# Patient Record
Sex: Male | Born: 1955 | Race: White | Hispanic: No | Marital: Married | Smoking: Former smoker
Health system: Southern US, Community
[De-identification: ages and names within clinical notes are randomized; demographics above are authoritative.]

## PROBLEM LIST (undated history)

## (undated) DIAGNOSIS — R011 Cardiac murmur, unspecified: Secondary | ICD-10-CM

## (undated) DIAGNOSIS — E7800 Pure hypercholesterolemia, unspecified: Secondary | ICD-10-CM

## (undated) DIAGNOSIS — I779 Disorder of arteries and arterioles, unspecified: Secondary | ICD-10-CM

## (undated) DIAGNOSIS — I739 Peripheral vascular disease, unspecified: Secondary | ICD-10-CM

## (undated) DIAGNOSIS — I2510 Atherosclerotic heart disease of native coronary artery without angina pectoris: Secondary | ICD-10-CM

## (undated) DIAGNOSIS — C61 Malignant neoplasm of prostate: Secondary | ICD-10-CM

## (undated) DIAGNOSIS — Z87442 Personal history of urinary calculi: Secondary | ICD-10-CM

## (undated) HISTORY — DX: Peripheral vascular disease, unspecified: I73.9

## (undated) HISTORY — DX: Disorder of arteries and arterioles, unspecified: I77.9

---
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
## 2002-12-08 ENCOUNTER — Ambulatory Visit (HOSPITAL_COMMUNITY): Admission: RE | Admit: 2002-12-08 | Discharge: 2002-12-08 | Payer: Self-pay | Admitting: Gastroenterology

## 2003-11-26 DIAGNOSIS — C61 Malignant neoplasm of prostate: Secondary | ICD-10-CM

## 2003-11-26 HISTORY — DX: Malignant neoplasm of prostate: C61

## 2003-11-26 HISTORY — PX: PROSTATE BIOPSY: SHX241

## 2003-11-26 HISTORY — PX: PROSTATECTOMY: SHX69

## 2004-06-25 ENCOUNTER — Ambulatory Visit: Admission: RE | Admit: 2004-06-25 | Discharge: 2004-08-09 | Payer: Self-pay | Admitting: Radiation Oncology

## 2004-06-27 ENCOUNTER — Inpatient Hospital Stay (HOSPITAL_COMMUNITY): Admission: RE | Admit: 2004-06-27 | Discharge: 2004-06-29 | Payer: Self-pay | Admitting: Urology

## 2005-05-06 ENCOUNTER — Emergency Department (HOSPITAL_COMMUNITY): Admission: EM | Admit: 2005-05-06 | Discharge: 2005-05-06 | Payer: Self-pay | Admitting: Emergency Medicine

## 2015-12-27 ENCOUNTER — Other Ambulatory Visit: Payer: Self-pay | Admitting: Gastroenterology

## 2016-01-19 ENCOUNTER — Encounter (HOSPITAL_COMMUNITY): Payer: Self-pay | Admitting: *Deleted

## 2016-01-29 ENCOUNTER — Ambulatory Visit (HOSPITAL_COMMUNITY)
Admission: RE | Admit: 2016-01-29 | Discharge: 2016-01-29 | Disposition: A | Discharge status: Home / Self Care | Payer: BLUE CROSS/BLUE SHIELD | Source: Ambulatory Visit | Attending: Gastroenterology | Admitting: Gastroenterology

## 2016-01-29 ENCOUNTER — Ambulatory Visit (HOSPITAL_COMMUNITY): Payer: BLUE CROSS/BLUE SHIELD | Admitting: Anesthesiology

## 2016-01-29 ENCOUNTER — Encounter (HOSPITAL_COMMUNITY): Payer: Self-pay | Admitting: Anesthesiology

## 2016-01-29 ENCOUNTER — Encounter (HOSPITAL_COMMUNITY)
Admission: RE | Disposition: A | Discharge status: Home / Self Care | Payer: Self-pay | Source: Ambulatory Visit | Attending: Gastroenterology

## 2016-01-29 DIAGNOSIS — K529 Noninfective gastroenteritis and colitis, unspecified: Secondary | ICD-10-CM | POA: Insufficient documentation

## 2016-01-29 DIAGNOSIS — Z86010 Personal history of colonic polyps: Secondary | ICD-10-CM | POA: Diagnosis not present

## 2016-01-29 DIAGNOSIS — K5730 Diverticulosis of large intestine without perforation or abscess without bleeding: Secondary | ICD-10-CM | POA: Insufficient documentation

## 2016-01-29 DIAGNOSIS — Z800 Family history of malignant neoplasm of digestive organs: Secondary | ICD-10-CM | POA: Insufficient documentation

## 2016-01-29 DIAGNOSIS — Z87891 Personal history of nicotine dependence: Secondary | ICD-10-CM | POA: Diagnosis not present

## 2016-01-29 DIAGNOSIS — Z1211 Encounter for screening for malignant neoplasm of colon: Secondary | ICD-10-CM | POA: Insufficient documentation

## 2016-01-29 DIAGNOSIS — K627 Radiation proctitis: Secondary | ICD-10-CM | POA: Insufficient documentation

## 2016-01-29 DIAGNOSIS — Z8546 Personal history of malignant neoplasm of prostate: Secondary | ICD-10-CM | POA: Insufficient documentation

## 2016-01-29 HISTORY — PX: COLONOSCOPY WITH PROPOFOL: SHX5780

## 2016-01-29 SURGERY — COLONOSCOPY WITH PROPOFOL
Anesthesia: Monitor Anesthesia Care

## 2016-01-29 MED ORDER — MIDAZOLAM HCL 5 MG/5ML IJ SOLN
INTRAMUSCULAR | Status: DC | PRN
  Administered 2016-01-29: 2 mg via INTRAVENOUS

## 2016-01-29 MED ORDER — MIDAZOLAM HCL 2 MG/2ML IJ SOLN
INTRAMUSCULAR | Status: AC
Start: 2016-01-29 — End: 2016-01-29
  Filled 2016-01-29: qty 2

## 2016-01-29 MED ORDER — PROPOFOL 500 MG/50ML IV EMUL
INTRAVENOUS | Status: DC | PRN
  Administered 2016-01-29: 125 ug/kg/min via INTRAVENOUS

## 2016-01-29 MED ORDER — LACTATED RINGERS IV SOLN
INTRAVENOUS | Status: DC
  Administered 2016-01-29: 1000 mL via INTRAVENOUS
  Administered 2016-01-29: 13:00:00 via INTRAVENOUS

## 2016-01-29 MED ORDER — PROPOFOL 10 MG/ML IV BOLUS
INTRAVENOUS | Status: AC
  Filled 2016-01-29: qty 20

## 2016-01-29 MED ORDER — SODIUM CHLORIDE 0.9 % IV SOLN: INTRAVENOUS | Status: DC

## 2016-01-29 MED ORDER — PROPOFOL 500 MG/50ML IV EMUL
INTRAVENOUS | Status: DC | PRN
  Administered 2016-01-29: 20 mg via INTRAVENOUS
  Administered 2016-01-29: 40 mg via INTRAVENOUS

## 2016-01-29 SURGICAL SUPPLY — 21 items

## 2016-01-29 NOTE — Anesthesia Postprocedure Evaluation (Signed)
Anesthesia Post Note  Patient: Joseph Jimenez  Procedure(s) Performed: Procedure(s) (LRB): COLONOSCOPY WITH PROPOFOL (N/A)  Patient location during evaluation: PACU Anesthesia Type: MAC Level of consciousness: awake and alert Pain management: pain level controlled Vital Signs Assessment: post-procedure vital signs reviewed and stable Respiratory status: spontaneous breathing, nonlabored ventilation, respiratory function stable and patient connected to nasal cannula oxygen Cardiovascular status: stable and blood pressure returned to baseline Anesthetic complications: no    Last Vitals:  Filed Vitals:   01/29/16 1420 01/29/16 1430  BP: 135/83 115/101  Pulse: 85 66  Temp:    Resp: 19 20    Last Pain: There were no vitals filed for this visit.               [name]

## 2016-01-29 NOTE — Op Note (Signed)
Procedure: Surveillance colonoscopy. 05/18/2009 normal surveillance colonoscopy was performed. 01/08/2003 colonoscopy was performed with removal of two diminutive tubular adenomatous polyps from the right colon.  Endoscopist: Earle Gell  Premedication: Propofol administered by anesthesia  Procedure: The patient was placed in the left lateral decubitus position. Anal inspection and digital rectal exam were normal. The Pentax pediatric colonoscope was introduced into the rectum and advanced to the cecum. A normal-appearing ileocecal valve and appendiceal orifice were identified. Colonic preparation for the exam today was good. Withdrawal time was 11 minutes  Rectum. 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 Random biopsies were performed.  Sigmoid colon and descending colon. Colonic diverticulosis  Splenic flexure. Normal  Transverse colon. Normal  Hepatic flexure. Normal  Ascending colon. Normal  Cecum and ileocecal valve. 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 Otherwise normal surveillance colonoscopy  Recommendation: Schedule repeat surveillance colonoscopy in 5 years.

## 2016-01-29 NOTE — Anesthesia Preprocedure Evaluation (Signed)
Anesthesia Evaluation  Patient identified by MRN, date of birth, ID band Patient awake    Reviewed: Allergy & Precautions, NPO status , Patient's Chart, lab work & pertinent test results  Airway Mallampati: II  TM Distance: >3 FB Neck ROM: Full    Dental   Pulmonary former smoker,    breath sounds clear to auscultation       Cardiovascular negative cardio ROS   Rhythm:Regular Rate:Normal     Neuro/Psych negative neurological ROS     GI/Hepatic negative GI ROS, Neg liver ROS,   Endo/Other  negative endocrine ROS  Renal/GU negative Renal ROS     Musculoskeletal   Abdominal   Peds  Hematology negative hematology ROS (+)   Anesthesia Other Findings   Reproductive/Obstetrics                             Anesthesia Physical Anesthesia Plan  ASA: II  Anesthesia Plan: MAC   Post-op Pain Management:    Induction: Intravenous  Airway Management Planned: Simple Face Mask and Natural Airway  Additional Equipment:   Intra-op Plan:   Post-operative Plan:   Informed Consent: I have reviewed the patients History and Physical, chart, labs and discussed the procedure including the risks, benefits and alternatives for the proposed anesthesia with the patient or authorized representative who has indicated his/her understanding and acceptance.     Plan Discussed with: CRNA  Anesthesia Plan Comments:         Anesthesia Quick Evaluation

## 2016-01-29 NOTE — H&P (Signed)
  Procedure: Surveillance colonoscopy. 05/14/2009 normal surveillance colon was performed. 01/08/2003 colonoscopy was performed with removal of two diminutive tubular adenomatous polyps from the right colon  History: The patient is a 60 year old male born 08/10/1956. He is scheduled to undergo a surveillance colonoscopy today.  Medication allergies: None  Past history: Prostate cancer.  Family history: Maternal grandfather died of colon cancer  Exam: The patient is alert and lying comfortably on the endoscopy stretcher. Abdomen is soft and nontender to palpation. Lungs are clear to auscultation. Cardiac exam reveals a regular rhythm.  Plan: Proceed with surveillance colonoscopy

## 2016-01-29 NOTE — Transfer of Care (Signed)
Immediate Anesthesia Transfer of Care Note  Patient: Joseph Jimenez  Procedure(s) Performed: Procedure(s): COLONOSCOPY WITH PROPOFOL (N/A)  Patient Location: PACU  Anesthesia Type:MAC  Level of Consciousness: awake, alert  and oriented  Airway & Oxygen Therapy: Patient Spontanous Breathing and Patient connected to face mask oxygen  Post-op Assessment: Report given to RN  Post vital signs: Reviewed and stable  Last Vitals:  Filed Vitals:   01/29/16 1240  BP: 185/83  Pulse: 74  Temp: 36.8 C  Resp: 14    Complications: No apparent anesthesia complications

## 2016-01-29 NOTE — Discharge Instructions (Signed)

## 2016-10-04 ENCOUNTER — Ambulatory Visit (INDEPENDENT_AMBULATORY_CARE_PROVIDER_SITE_OTHER): Payer: BLUE CROSS/BLUE SHIELD | Admitting: Internal Medicine

## 2016-10-04 ENCOUNTER — Encounter: Payer: Self-pay | Admitting: Internal Medicine

## 2016-10-04 ENCOUNTER — Other Ambulatory Visit: Payer: Self-pay | Admitting: *Deleted

## 2016-10-04 VITALS — BP 140/80 | HR 74 | Ht 71.0 in | Wt 212.0 lb

## 2016-10-04 DIAGNOSIS — R0989 Other specified symptoms and signs involving the circulatory and respiratory systems: Secondary | ICD-10-CM

## 2016-10-04 DIAGNOSIS — R072 Precordial pain: Secondary | ICD-10-CM

## 2016-10-04 MED ORDER — NITROGLYCERIN 0.4 MG SL SUBL: 0.4000 mg | SUBLINGUAL_TABLET | SUBLINGUAL | 3 refills | Status: DC | PRN

## 2016-10-04 NOTE — Patient Instructions (Addendum)
Your physician recommends that you schedule a follow-up appointment in: after tests  Monday 10/07/16,  FASTING lab work :Lipids, CBC at [facility] 3 rd floor    [phone]   Tuesday 10/08/16 at 1300 hrs for Carotid Ultrasound at [facility], off [address]   Parking code :0004    Dr Harrington Challenger cell  [phone]       Start Aspirin 81 mg daily    Take nitroglycerin  As needed    Nitroglycerin sublingual tablets What is this medicine? NITROGLYCERIN (nye troe GLI ser in) is a type of vasodilator. It relaxes blood vessels, increasing the blood and oxygen supply to your heart. This medicine is used to relieve chest pain caused by angina. It is also used to prevent chest pain before activities like climbing stairs, going outdoors in cold weather, or sexual activity. This medicine may be used for other purposes; ask your health care provider or pharmacist if you have questions. What should I tell my health care provider before I take this medicine? They need to know if you have any of these conditions: -anemia -head injury, recent stroke, or bleeding in the brain -liver disease -previous heart attack -an unusual or allergic reaction to nitroglycerin, other medicines, foods, dyes, or preservatives -pregnant or trying to get pregnant -breast-feeding How should I use this medicine? Take this medicine by mouth as needed. At the first sign of an angina attack (chest pain or tightness) place one tablet under your tongue. You can also take this medicine 5 to 10 minutes before an event likely to produce chest pain. Follow the directions on the prescription label. Let the tablet dissolve under the tongue. Do not swallow whole. Replace the dose if you accidentally swallow it. It will help if your mouth is not dry. Saliva around the tablet will help it to dissolve more quickly. Do not eat or drink, smoke or chew tobacco while a tablet is  dissolving. If you are not better within 5 minutes after taking ONE dose of nitroglycerin, call 9-1-1 immediately to seek emergency medical care. Do not take more than 3 nitroglycerin tablets over 15 minutes. If you take this medicine often to relieve symptoms of angina, your doctor or health care professional may provide you with different instructions to manage your symptoms. If symptoms do not go away after following these instructions, it is important to call 9-1-1 immediately. Do not take more than 3 nitroglycerin tablets over 15 minutes. Talk to your pediatrician regarding the use of this medicine in children. Special care may be needed. Overdosage: If you think you have taken too much of this medicine contact a poison control center or emergency room at once. NOTE: This medicine is only for you. Do not share this medicine with others. What if I miss a dose? This does not apply. This medicine is only used as needed. What may interact with this medicine? Do not take this medicine with any of the following medications: -certain migraine medicines like ergotamine and dihydroergotamine (DHE) -medicines used to treat erectile dysfunction like sildenafil, tadalafil, and vardenafil -riociguat This medicine may also interact with the following medications: -alteplase -aspirin -heparin -medicines for high blood pressure -medicines for mental depression -other medicines used to treat angina -phenothiazines like chlorpromazine, mesoridazine, prochlorperazine, thioridazine This list may not describe all possible interactions. Give your health care provider a list of all the medicines, herbs, non-prescription drugs, or dietary supplements you use. Also tell them if you smoke, drink  alcohol, or use illegal drugs. Some items may interact with your medicine. What should I watch for while using this medicine? Tell your doctor or health care professional if you feel your medicine is no longer  working. Keep this medicine with you at all times. Sit or lie down when you take your medicine to prevent falling if you feel dizzy or faint after using it. Try to remain calm. This will help you to feel better faster. If you feel dizzy, take several deep breaths and lie down with your feet propped up, or bend forward with your head resting between your knees. You may get drowsy or dizzy. Do not drive, use machinery, or do anything that needs mental alertness until you know how this drug affects you. Do not stand or sit up quickly, especially if you are an older patient. This reduces the risk of dizzy or fainting spells. Alcohol can make you more drowsy and dizzy. Avoid alcoholic drinks. Do not treat yourself for coughs, colds, or pain while you are taking this medicine without asking your doctor or health care professional for advice. Some ingredients may increase your blood pressure. What side effects may I notice from receiving this medicine? Side effects that you should report to your doctor or health care professional as soon as possible: -blurred vision -dry mouth -skin rash -sweating -the feeling of extreme pressure in the head -unusually weak or tired Side effects that usually do not require medical attention (report to your doctor or health care professional if they continue or are bothersome): -flushing of the face or neck -headache -irregular heartbeat, palpitations -nausea, vomiting This list may not describe all possible side effects. Call your doctor for medical advice about side effects. You may report side effects to FDA at 1-800-FDA-1088. Where should I keep my medicine? Keep out of the reach of children. Store at room temperature between 20 and 25 degrees C (68 and 77 degrees F). Store in Chief of Staff. Protect from light and moisture. Keep tightly closed. Throw away any unused medicine after the expiration date. NOTE: This sheet is a summary. It may not cover all possible  information. If you have questions about this medicine, talk to your doctor, pharmacist, or health care provider.    2016, Elsevier/Gold Standard. (2013-09-09 17:57:36)

## 2016-10-04 NOTE — Progress Notes (Signed)
Cardiology Office Note   Date:  10/04/2016   ID:  Joseph Jimenez, DOB [birth year], MRN [id number]  PCP:  Suzi Roots, MD  Cardiologist:   Dorris Carnes, MD    Pt is self referred for CP   .   History of Present Illness: Joseph Jimenez is a 60 y.o. male with a history of CP  Started This summer  First spell was worst  Occurred while playing golf  He walks and carries bag  Episode occurred when he was just starting out playing that day  Weather was Humid and hot  Couldn't breathe normal  Pain in mid chest  Stopped waking  When got going again  Got worse   Went away for 20 min then went away  Felt OK the rest of the time  Finished playing   Was able to walk faster without a problem   Occasional times after that has had again  Shorter than first spell, Episodes last a few min then gone  No wheezing  Uncomfortable     Then goes away  Episodes occur early on and he is able to play  Tennis late summer,early fall  Hot  Lasted 10 min  Went away  2 days ago he had a spell when he started to play platform tennis  Just starting  Lasted about 5 to 8 min  One thing different that day was that it was cool and rainy  After that he called in to be seen     No episodes at rest  Actvie at other times      No outpatient prescriptions prior to visit.   No facility-administered medications prior to visit.      Allergies:   Patient has no known allergies.   No past medical history on file.  Past Surgical History:  Procedure Laterality Date  . COLONOSCOPY WITH PROPOFOL N/A 01/29/2016   Procedure: COLONOSCOPY WITH PROPOFOL;  Surgeon: Garlan Fair, MD;  Location: [facility];  Service: Endoscopy;  Laterality: N/A;  . HERNIA REPAIR     as child-6 months old -bilateral  . PROSTATE SURGERY       Social History:  The patient  reports that he quit smoking about 22 years ago. His smoking use included Cigarettes. He does not have any smokeless tobacco history on file. He reports that he  does not drink alcohol or use drugs.   Family History:  The patient's family history is not on file.  Mother had heart attack  Silent  ? More in 70d  ROS:  Please see the history of present illness. All other systems are reviewed and  Negative to the above problem except as noted.    PHYSICAL EXAM: VS:  BP 140/80 (BP Location: Right Arm)   Pulse 74   Ht 5\' 11"  (1.803 m)   Wt 212 lb (96.2 kg)   SpO2 95%   BMI 29.57 kg/m   GEN: Well nourished, well developed, in no acute distress  HEENT: normal  Neck: no JVD  Bilateral carotid bruits   NO masses Cardiac: RRR; no murmurs, rubs, or gallops,no edema  Respiratory:  clear to auscultation bilaterally, normal work of breathing GI: soft, nontender, nondistended, + BS  No hepatomegaly  MS: no deformity Moving all extremities   Skin: warm and dry, no rash Neuro:  Strength and sensation are intact Psych: euthymic mood, full affect   EKG:  EKG is ordered today  SR 77 bpm    Lipid Panel  No results found for: CHOL, TRIG, HDL, CHOLHDL, VLDL, LDLCALC, LDLDIRECT    Wt Readings from Last 3 Encounters:  10/04/16 212 lb (96.2 kg)  01/29/16 205 lb (93 kg)      ASSESSMENT AND PLAN:  1  CP  Concerning  Not completely typical for angina  He is able to play after spells resolve  Spellls doe not occur at peak exercise. He denies wheezing. He has a history of HL  On exam bilateral carotid bruits  Recomm:  Continue ASA   Rx for NTG given  Activities as tolerated for now. Set up for GXT Check lipids and CBC  2  Carotid bruits  Set up for carotid USN 3  HL  Check lipids  F/U based on test results     Current medicines are reviewed at length with the patient today.  The patient does not have concerns regarding medicines.  Signed, Dorris Carnes, MD  10/04/2016 3:06 PM    [facility] [address], [place], [place]  [address] Phone: [phone]; Fax: [phone]

## 2016-10-04 NOTE — Progress Notes (Signed)
Call from Dr. Harrington Challenger to schedule GXT for patient next week and she be present.  Per Joseph Jimenez, ok for patient to come in at 9:30 am on 11/14 for GXT.  Dr. Harrington Challenger will inform patient.    Will ask [facility] to schedule this on Monday when they return.  Pt also scheduled for carotid US at the hospital, order placed for that, and has lab appointment for Mon at [facility]   Joseph Jimenez was seen by DR. Ross today at [facility].

## 2016-10-07 ENCOUNTER — Other Ambulatory Visit: Payer: BLUE CROSS/BLUE SHIELD | Admitting: *Deleted

## 2016-10-07 ENCOUNTER — Other Ambulatory Visit: Payer: Self-pay | Admitting: *Deleted

## 2016-10-07 DIAGNOSIS — E7800 Pure hypercholesterolemia, unspecified: Secondary | ICD-10-CM

## 2016-10-07 DIAGNOSIS — R0789 Other chest pain: Secondary | ICD-10-CM

## 2016-10-07 LAB — CBC WITH DIFFERENTIAL/PLATELET
BASOS PCT: 1 %
Basophils Absolute: 78 cells/uL (ref 0–200)
EOS PCT: 3 %
Eosinophils Absolute: 234 cells/uL (ref 15–500)
HEMATOCRIT: 46.8 % (ref 38.5–50.0)
HEMOGLOBIN: 16.2 g/dL (ref 13.2–17.1)
LYMPHS ABS: 2340 {cells}/uL (ref 850–3900)
LYMPHS PCT: 30 %
MCH: 30.1 pg (ref 27.0–33.0)
MCHC: 34.6 g/dL (ref 32.0–36.0)
MCV: 87 fL (ref 80.0–100.0)
MONO ABS: 468 {cells}/uL (ref 200–950)
MPV: 11.2 fL (ref 7.5–12.5)
Monocytes Relative: 6 %
Neutro Abs: 4680 cells/uL (ref 1500–7800)
Neutrophils Relative %: 60 %
Platelets: 188 10*3/uL (ref 140–400)
RBC: 5.38 MIL/uL (ref 4.20–5.80)
RDW: 14.2 % (ref 11.0–15.0)
WBC: 7.8 10*3/uL (ref 3.8–10.8)

## 2016-10-07 LAB — LIPID PANEL
Cholesterol: 244 mg/dL — ABNORMAL HIGH (ref <200)
HDL: 57 mg/dL (ref >40)
LDL Cholesterol: 166 mg/dL — ABNORMAL HIGH (ref <100)
Total CHOL/HDL Ratio: 4.3 Ratio (ref <5.0)
Triglycerides: 106 mg/dL (ref <150)
VLDL: 21 mg/dL (ref <30)

## 2016-10-08 ENCOUNTER — Encounter (HOSPITAL_COMMUNITY)
Admission: AD | Disposition: A | Discharge status: Home / Self Care | Payer: Self-pay | Source: Ambulatory Visit | Attending: Thoracic Surgery (Cardiothoracic Vascular Surgery)

## 2016-10-08 ENCOUNTER — Other Ambulatory Visit: Payer: Self-pay | Admitting: *Deleted

## 2016-10-08 ENCOUNTER — Encounter (HOSPITAL_COMMUNITY): Payer: Self-pay | Admitting: Interventional Cardiology

## 2016-10-08 ENCOUNTER — Ambulatory Visit (INDEPENDENT_AMBULATORY_CARE_PROVIDER_SITE_OTHER): Payer: BLUE CROSS/BLUE SHIELD

## 2016-10-08 ENCOUNTER — Ambulatory Visit (HOSPITAL_BASED_OUTPATIENT_CLINIC_OR_DEPARTMENT_OTHER)
Admission: RE | Admit: 2016-10-08 | Discharge: 2016-10-08 | Disposition: A | Discharge status: Home / Self Care | Payer: BLUE CROSS/BLUE SHIELD | Source: Ambulatory Visit | Attending: Diagnostic Radiology | Admitting: Diagnostic Radiology

## 2016-10-08 ENCOUNTER — Inpatient Hospital Stay (HOSPITAL_COMMUNITY)
Admission: AD | Admit: 2016-10-08 | Discharge: 2016-10-13 | DRG: 234 | Disposition: A | Discharge status: Home / Self Care | Payer: BLUE CROSS/BLUE SHIELD | Source: Ambulatory Visit | Attending: Thoracic Surgery (Cardiothoracic Vascular Surgery) | Admitting: Thoracic Surgery (Cardiothoracic Vascular Surgery)

## 2016-10-08 DIAGNOSIS — Z01810 Encounter for preprocedural cardiovascular examination: Secondary | ICD-10-CM | POA: Diagnosis not present

## 2016-10-08 DIAGNOSIS — I200 Unstable angina: Secondary | ICD-10-CM | POA: Diagnosis present

## 2016-10-08 DIAGNOSIS — Z8546 Personal history of malignant neoplasm of prostate: Secondary | ICD-10-CM | POA: Diagnosis not present

## 2016-10-08 DIAGNOSIS — I2510 Atherosclerotic heart disease of native coronary artery without angina pectoris: Secondary | ICD-10-CM

## 2016-10-08 DIAGNOSIS — Z01818 Encounter for other preprocedural examination: Secondary | ICD-10-CM | POA: Diagnosis not present

## 2016-10-08 DIAGNOSIS — I493 Ventricular premature depolarization: Secondary | ICD-10-CM | POA: Diagnosis present

## 2016-10-08 DIAGNOSIS — E7849 Other hyperlipidemia: Secondary | ICD-10-CM

## 2016-10-08 DIAGNOSIS — I25110 Atherosclerotic heart disease of native coronary artery with unstable angina pectoris: Secondary | ICD-10-CM | POA: Diagnosis not present

## 2016-10-08 DIAGNOSIS — R0789 Other chest pain: Secondary | ICD-10-CM | POA: Diagnosis not present

## 2016-10-08 DIAGNOSIS — Z87442 Personal history of urinary calculi: Secondary | ICD-10-CM | POA: Diagnosis not present

## 2016-10-08 DIAGNOSIS — Z79899 Other long term (current) drug therapy: Secondary | ICD-10-CM

## 2016-10-08 DIAGNOSIS — E784 Other hyperlipidemia: Secondary | ICD-10-CM | POA: Diagnosis not present

## 2016-10-08 DIAGNOSIS — Z8249 Family history of ischemic heart disease and other diseases of the circulatory system: Secondary | ICD-10-CM | POA: Diagnosis not present

## 2016-10-08 DIAGNOSIS — E785 Hyperlipidemia, unspecified: Secondary | ICD-10-CM | POA: Diagnosis not present

## 2016-10-08 DIAGNOSIS — N359 Urethral stricture, unspecified: Secondary | ICD-10-CM | POA: Diagnosis present

## 2016-10-08 DIAGNOSIS — Z7982 Long term (current) use of aspirin: Secondary | ICD-10-CM | POA: Diagnosis not present

## 2016-10-08 DIAGNOSIS — R011 Cardiac murmur, unspecified: Secondary | ICD-10-CM | POA: Diagnosis present

## 2016-10-08 DIAGNOSIS — R338 Other retention of urine: Secondary | ICD-10-CM | POA: Diagnosis not present

## 2016-10-08 DIAGNOSIS — R0989 Other specified symptoms and signs involving the circulatory and respiratory systems: Secondary | ICD-10-CM

## 2016-10-08 DIAGNOSIS — N358 Other urethral stricture: Secondary | ICD-10-CM | POA: Diagnosis not present

## 2016-10-08 DIAGNOSIS — I25119 Atherosclerotic heart disease of native coronary artery with unspecified angina pectoris: Secondary | ICD-10-CM | POA: Diagnosis not present

## 2016-10-08 DIAGNOSIS — D62 Acute posthemorrhagic anemia: Secondary | ICD-10-CM | POA: Diagnosis not present

## 2016-10-08 DIAGNOSIS — J9811 Atelectasis: Secondary | ICD-10-CM | POA: Diagnosis not present

## 2016-10-08 DIAGNOSIS — Z9889 Other specified postprocedural states: Secondary | ICD-10-CM

## 2016-10-08 DIAGNOSIS — Z951 Presence of aortocoronary bypass graft: Secondary | ICD-10-CM

## 2016-10-08 DIAGNOSIS — I370 Nonrheumatic pulmonary valve stenosis: Secondary | ICD-10-CM | POA: Diagnosis not present

## 2016-10-08 DIAGNOSIS — Z87891 Personal history of nicotine dependence: Secondary | ICD-10-CM | POA: Diagnosis not present

## 2016-10-08 DIAGNOSIS — I080 Rheumatic disorders of both mitral and aortic valves: Secondary | ICD-10-CM | POA: Diagnosis not present

## 2016-10-08 DIAGNOSIS — R079 Chest pain, unspecified: Secondary | ICD-10-CM

## 2016-10-08 HISTORY — DX: Pure hypercholesterolemia, unspecified: E78.00

## 2016-10-08 HISTORY — DX: Personal history of urinary calculi: Z87.442

## 2016-10-08 HISTORY — DX: Cardiac murmur, unspecified: R01.1

## 2016-10-08 HISTORY — PX: CARDIAC CATHETERIZATION: SHX172

## 2016-10-08 HISTORY — DX: Atherosclerotic heart disease of native coronary artery without angina pectoris: I25.10

## 2016-10-08 HISTORY — DX: Malignant neoplasm of prostate: C61

## 2016-10-08 LAB — VAS US CAROTID
LCCADSYS: -114 cm/s
LCCAPDIAS: 25 cm/s
LCCAPSYS: 111 cm/s
LEFT ECA DIAS: -41 cm/s
LEFT VERTEBRAL DIAS: -13 cm/s
LICADDIAS: -26 cm/s
LICAPSYS: -89 cm/s
Left CCA dist dias: -35 cm/s
Left ICA dist sys: -66 cm/s
Left ICA prox dias: -26 cm/s
RCCAPDIAS: 11 cm/s
RCCAPSYS: 89 cm/s
RIGHT ECA DIAS: -14 cm/s
RIGHT VERTEBRAL DIAS: -13 cm/s
Right cca dist sys: -43 cm/s

## 2016-10-08 LAB — BASIC METABOLIC PANEL
ANION GAP: 8 (ref 5–15)
BUN: 13 mg/dL (ref 6–20)
CHLORIDE: 103 mmol/L (ref 101–111)
CO2: 25 mmol/L (ref 22–32)
CREATININE: 0.88 mg/dL (ref 0.61–1.24)
Calcium: 9.1 mg/dL (ref 8.9–10.3)
GFR calc non Af Amer: >60 mL/min (ref >60)
Glucose, Bld: 94 mg/dL (ref 65–99)
Potassium: 4.1 mmol/L (ref 3.5–5.1)
SODIUM: 136 mmol/L (ref 135–145)

## 2016-10-08 LAB — PROTIME-INR
INR: 0.93
PROTHROMBIN TIME: 12.5 s (ref 11.4–15.2)

## 2016-10-08 SURGERY — LEFT HEART CATH AND CORONARY ANGIOGRAPHY

## 2016-10-08 MED ORDER — HEPARIN (PORCINE) IN NACL 2-0.9 UNIT/ML-% IJ SOLN
INTRAMUSCULAR | Status: DC | PRN
  Administered 2016-10-08: 1000 mL

## 2016-10-08 MED ORDER — SODIUM CHLORIDE 0.9 % IV SOLN: 250.0000 mL | INTRAVENOUS | Status: DC | PRN

## 2016-10-08 MED ORDER — VERAPAMIL HCL 2.5 MG/ML IV SOLN
INTRAVENOUS | Status: AC
  Filled 2016-10-08: qty 2

## 2016-10-08 MED ORDER — METOPROLOL TARTRATE 12.5 MG HALF TABLET
12.5000 mg | ORAL_TABLET | Freq: Two times a day (BID) | ORAL | Status: DC
  Administered 2016-10-08 – 2016-10-09 (×2): 12.5 mg via ORAL
  Filled 2016-10-08 (×3): qty 1

## 2016-10-08 MED ORDER — HEPARIN SODIUM (PORCINE) 1000 UNIT/ML IJ SOLN
INTRAMUSCULAR | Status: AC
  Filled 2016-10-08: qty 1

## 2016-10-08 MED ORDER — IOPAMIDOL (ISOVUE-370) INJECTION 76%
INTRAVENOUS | Status: AC
  Filled 2016-10-08: qty 100

## 2016-10-08 MED ORDER — ASPIRIN 81 MG PO CHEW
81.0000 mg | CHEWABLE_TABLET | ORAL | Status: AC
  Administered 2016-10-08: 81 mg via ORAL

## 2016-10-08 MED ORDER — ENOXAPARIN SODIUM 100 MG/ML ~~LOC~~ SOLN
90.0000 mg | Freq: Two times a day (BID) | SUBCUTANEOUS | Status: AC
  Administered 2016-10-08 – 2016-10-09 (×2): 90 mg via SUBCUTANEOUS
  Filled 2016-10-08 (×2): qty 0.9

## 2016-10-08 MED ORDER — ASPIRIN 81 MG PO CHEW
81.0000 mg | CHEWABLE_TABLET | Freq: Every day | ORAL | Status: DC
  Administered 2016-10-09: 81 mg via ORAL
  Filled 2016-10-08: qty 1

## 2016-10-08 MED ORDER — SODIUM CHLORIDE 0.9% FLUSH
3.0000 mL | Freq: Two times a day (BID) | INTRAVENOUS | Status: DC
  Administered 2016-10-09 (×2): 3 mL via INTRAVENOUS

## 2016-10-08 MED ORDER — SODIUM CHLORIDE 0.9% FLUSH: 3.0000 mL | INTRAVENOUS | Status: DC | PRN

## 2016-10-08 MED ORDER — IOPAMIDOL (ISOVUE-370) INJECTION 76%
INTRAVENOUS | Status: DC | PRN
  Administered 2016-10-08: 90 mL via INTRA_ARTERIAL

## 2016-10-08 MED ORDER — MIDAZOLAM HCL 2 MG/2ML IJ SOLN
INTRAMUSCULAR | Status: AC
  Filled 2016-10-08: qty 2

## 2016-10-08 MED ORDER — SODIUM CHLORIDE 0.9 % IV SOLN
INTRAVENOUS | Status: DC
Start: 2016-10-08 — End: 2016-10-08
  Administered 2016-10-08: 13:00:00 via INTRAVENOUS

## 2016-10-08 MED ORDER — VERAPAMIL HCL 2.5 MG/ML IV SOLN
INTRAVENOUS | Status: DC | PRN
  Administered 2016-10-08: 10 mL via INTRA_ARTERIAL

## 2016-10-08 MED ORDER — FENTANYL CITRATE (PF) 100 MCG/2ML IJ SOLN
INTRAMUSCULAR | Status: AC
  Filled 2016-10-08: qty 2

## 2016-10-08 MED ORDER — OXYCODONE-ACETAMINOPHEN 5-325 MG PO TABS: 1.0000 | ORAL_TABLET | ORAL | Status: DC | PRN

## 2016-10-08 MED ORDER — SODIUM CHLORIDE 0.9% FLUSH: 3.0000 mL | Freq: Two times a day (BID) | INTRAVENOUS | Status: DC

## 2016-10-08 MED ORDER — MIDAZOLAM HCL 2 MG/2ML IJ SOLN
INTRAMUSCULAR | Status: DC | PRN
  Administered 2016-10-08: 1 mg via INTRAVENOUS

## 2016-10-08 MED ORDER — HEPARIN (PORCINE) IN NACL 2-0.9 UNIT/ML-% IJ SOLN
INTRAMUSCULAR | Status: AC
  Filled 2016-10-08: qty 1000

## 2016-10-08 MED ORDER — ATORVASTATIN CALCIUM 80 MG PO TABS
80.0000 mg | ORAL_TABLET | Freq: Every day | ORAL | Status: DC
  Administered 2016-10-08 – 2016-10-11 (×4): 80 mg via ORAL
  Filled 2016-10-08 (×4): qty 1

## 2016-10-08 MED ORDER — ~~LOC~~ CARDIAC SURGERY, PATIENT & FAMILY EDUCATION
Freq: Once | Status: AC
  Administered 2016-10-08: 19:00:00
  Filled 2016-10-08: qty 1

## 2016-10-08 MED ORDER — ONDANSETRON HCL 4 MG/2ML IJ SOLN: 4.0000 mg | Freq: Four times a day (QID) | INTRAMUSCULAR | Status: DC | PRN

## 2016-10-08 MED ORDER — SODIUM CHLORIDE 0.9 % WEIGHT BASED INFUSION
1.0000 mL/kg/h | INTRAVENOUS | Status: AC
  Administered 2016-10-08: 17:00:00 1 mL/kg/h via INTRAVENOUS

## 2016-10-08 MED ORDER — ACETAMINOPHEN 325 MG PO TABS: 650.0000 mg | ORAL_TABLET | ORAL | Status: DC | PRN

## 2016-10-08 MED ORDER — FENTANYL CITRATE (PF) 100 MCG/2ML IJ SOLN
INTRAMUSCULAR | Status: DC | PRN
  Administered 2016-10-08: 50 ug via INTRAVENOUS

## 2016-10-08 MED ORDER — HEPARIN SODIUM (PORCINE) 1000 UNIT/ML IJ SOLN
INTRAMUSCULAR | Status: DC | PRN
  Administered 2016-10-08: 4500 [IU] via INTRAVENOUS

## 2016-10-08 MED ORDER — LIDOCAINE HCL (PF) 1 % IJ SOLN
INTRAMUSCULAR | Status: DC | PRN
  Administered 2016-10-08: 3 mL

## 2016-10-08 MED ORDER — LIDOCAINE HCL (PF) 1 % IJ SOLN
INTRAMUSCULAR | Status: AC
  Filled 2016-10-08: qty 30

## 2016-10-08 MED ORDER — ASPIRIN 81 MG PO CHEW
CHEWABLE_TABLET | ORAL | Status: AC
  Filled 2016-10-08: qty 1

## 2016-10-08 SURGICAL SUPPLY — 10 items
CATH INFINITI 5 FR JL3.5 (CATHETERS) ×3 IMPLANT
CATH INFINITI JR4 5F (CATHETERS) ×3 IMPLANT
DEVICE RAD COMP TR BAND LRG (VASCULAR PRODUCTS) ×3 IMPLANT
GLIDESHEATH SLEND A-KIT 6F 22G (SHEATH) ×3 IMPLANT
GUIDEWIRE INQWIRE 1.5J.035X260 (WIRE) ×1 IMPLANT
INQWIRE 1.5J .035X260CM (WIRE) ×3
KIT HEART LEFT (KITS) ×3 IMPLANT
PACK CARDIAC CATHETERIZATION (CUSTOM PROCEDURE TRAY) ×3 IMPLANT
TRANSDUCER W/STOPCOCK (MISCELLANEOUS) ×3 IMPLANT
TUBING CIL FLEX 10 FLL-RA (TUBING) ×3 IMPLANT

## 2016-10-08 NOTE — H&P (View-Only) (Signed)
Pateint presented for exercise GXT.(Bruce protocol) IN stage II of test had increasing ventricular ectopy with PVCs, couplets and triplets   ST with some ST T wave changes  Treadmill stopped  Patient remained asymptomatic Reviewed with patient   WOuld recomm L  Heart cath to define anatomy Risks/benefits explained  Pt understands and agrees to proceed.

## 2016-10-08 NOTE — Research (Signed)
CADLAD Informed Consent   Subject Name: Joseph Jimenez  Subject met inclusion and exclusion criteria.  The informed consent form, study requirements and expectations were reviewed with the subject and questions and concerns were addressed prior to the signing of the consent form.  The subject verbalized understanding of the trial requirements.  The subject agreed to participate in the trial and signed the informed consent.  The informed consent was obtained prior to performance of any protocol-specific procedures for the subject.  A copy of the signed informed consent was given to the subject and a copy was placed in the subject's medical record.  [name] 10/08/2016, 2:00 PM

## 2016-10-08 NOTE — Interval H&P Note (Signed)
    Cath Lab Visit (complete for each Cath Lab visit)  Clinical Evaluation Leading to the Procedure:   ACS: Yes.    Non-ACS:    Anginal Classification: CCS III  Anti-ischemic medical therapy: Maximal Therapy (2 or more classes of medications)  Non-Invasive Test Results: Intermediate-risk stress test findings: cardiac mortality 1-3%/year  Prior CABG: No previous CABG      History and Physical Interval Note:  10/08/2016 3:03 PM  Joseph Jimenez  has presented today for surgery, with the diagnosis of positive stress test  The various methods of treatment have been discussed with the patient and family. After consideration of risks, benefits and other options for treatment, the patient has consented to  Procedure(s): Left Heart Cath and Coronary Angiography (N/A) as a surgical intervention .  The patient's history has been reviewed, patient examined, no change in status, stable for surgery.  I have reviewed the patient's chart and labs.  Questions were answered to the patient's satisfaction.     Belva Crome III

## 2016-10-08 NOTE — Progress Notes (Signed)
**  Preliminary report by tech**  Carotid artery duplex complete. Findings are consistent with a 1-39 percent stenosis involving the right internal carotid artery and the left internal carotid artery. The vertebral arteries demonstrate antegrade flow.  10/08/16 1:09 PM Joseph Jimenez RVT

## 2016-10-08 NOTE — Progress Notes (Signed)
Pateint presented for exercise GXT.(Bruce protocol) IN stage II of test had increasing ventricular ectopy with PVCs, couplets and triplets   ST with some ST T wave changes  Treadmill stopped  Patient remained asymptomatic Reviewed with patient   WOuld recomm L  Heart cath to define anatomy Risks/benefits explained  Pt understands and agrees to proceed.

## 2016-10-08 NOTE — Progress Notes (Signed)
ANTICOAGULATION CONSULT NOTE - Initial Consult  Pharmacy Consult for Lovenox Indication: chest pain/ACS  No Known Allergies  Patient Measurements: Height: 5\' 11"  (180.3 cm) Weight: 207 lb (93.9 kg) IBW/kg (Calculated) : 75.3  Vital Signs: Temp: 97.2 F (36.2 C) (11/14 1602) Temp Source: Oral (11/14 1602) BP: 162/92 (11/14 1602) Pulse Rate: 74 (11/14 1602)  Labs:  Recent Labs  10/07/16 0816 10/08/16 1228  HGB 16.2  --   HCT 46.8  --   PLT 188  --   LABPROT  --  12.5  INR  --  0.93  CREATININE  --  0.88    Estimated Creatinine Clearance: 105.7 mL/min (by C-G formula based on SCr of 0.88 mg/dL).  Assessment: 60 year old male to begin Lovenox s/p cath 8 hours after sheath removal pending CVTS evaluation for CABG  Sheath removed at 1500 pm  Goal of Therapy:  Anti-Xa level 0.6-1 units/ml 4hrs after LMWH dose given Monitor platelets by anticoagulation protocol: Yes   Plan:  Lovenox 90 mg sq Q 12 hours starting at 2300 pm Follow CBC, Scr  Thank you Anette Guarneri, PharmD [phone]  10/08/2016,4:56 PM

## 2016-10-09 ENCOUNTER — Inpatient Hospital Stay (HOSPITAL_COMMUNITY): Payer: BLUE CROSS/BLUE SHIELD

## 2016-10-09 ENCOUNTER — Encounter (HOSPITAL_COMMUNITY): Payer: Self-pay | Admitting: Interventional Cardiology

## 2016-10-09 DIAGNOSIS — I25110 Atherosclerotic heart disease of native coronary artery with unstable angina pectoris: Secondary | ICD-10-CM

## 2016-10-09 DIAGNOSIS — Z01810 Encounter for preprocedural cardiovascular examination: Secondary | ICD-10-CM

## 2016-10-09 DIAGNOSIS — I200 Unstable angina: Secondary | ICD-10-CM

## 2016-10-09 DIAGNOSIS — E784 Other hyperlipidemia: Secondary | ICD-10-CM

## 2016-10-09 LAB — ABO/RH: ABO/RH(D): B POS

## 2016-10-09 LAB — CBC
HCT: 45.3 % (ref 39.0–52.0)
HEMOGLOBIN: 15.7 g/dL (ref 13.0–17.0)
MCH: 29.6 pg (ref 26.0–34.0)
MCHC: 34.7 g/dL (ref 30.0–36.0)
MCV: 85.3 fL (ref 78.0–100.0)
PLATELETS: 177 10*3/uL (ref 150–400)
RBC: 5.31 MIL/uL (ref 4.22–5.81)
RDW: 13 % (ref 11.5–15.5)
WBC: 9.2 10*3/uL (ref 4.0–10.5)

## 2016-10-09 LAB — PULMONARY FUNCTION TEST
DL/VA % PRED: 104 %
DL/VA: 4.91 ml/min/mmHg/L
DLCO COR % PRED: 77 %
DLCO COR: 26.23 ml/min/mmHg
DLCO UNC % PRED: 80 %
DLCO unc: 27 ml/min/mmHg
FEF 25-75 POST: 4.49 L/s
FEF 25-75 PRE: 3.61 L/s
FEF2575-%CHANGE-POST: 24 %
FEF2575-%PRED-POST: 145 %
FEF2575-%PRED-PRE: 117 %
FEV1-%Change-Post: 7 %
FEV1-%PRED-PRE: 79 %
FEV1-%Pred-Post: 85 %
FEV1-POST: 3.22 L
FEV1-Pre: 2.99 L
FEV1FVC-%CHANGE-POST: 0 %
FEV1FVC-%PRED-PRE: 111 %
FEV6-%CHANGE-POST: 7 %
FEV6-%PRED-PRE: 74 %
FEV6-%Pred-Post: 79 %
FEV6-Post: 3.78 L
FEV6-Pre: 3.52 L
FEV6FVC-%Change-Post: 0 %
FEV6FVC-%Pred-Post: 104 %
FEV6FVC-%Pred-Pre: 103 %
FVC-%Change-Post: 6 %
FVC-%PRED-POST: 76 %
FVC-%PRED-PRE: 71 %
FVC-POST: 3.78 L
FVC-PRE: 3.54 L
POST FEV1/FVC RATIO: 85 %
POST FEV6/FVC RATIO: 100 %
PRE FEV1/FVC RATIO: 84 %
Pre FEV6/FVC Ratio: 100 %
RV % pred: 101 %
RV: 2.32 L
TLC % pred: 86 %
TLC: 6.19 L

## 2016-10-09 LAB — BASIC METABOLIC PANEL
ANION GAP: 9 (ref 5–15)
BUN: 13 mg/dL (ref 6–20)
CALCIUM: 8.7 mg/dL — AB (ref 8.9–10.3)
CO2: 24 mmol/L (ref 22–32)
CREATININE: 0.83 mg/dL (ref 0.61–1.24)
Chloride: 103 mmol/L (ref 101–111)
GFR calc Af Amer: >60 mL/min (ref >60)
GLUCOSE: 101 mg/dL — AB (ref 65–99)
Potassium: 3.7 mmol/L (ref 3.5–5.1)
Sodium: 136 mmol/L (ref 135–145)

## 2016-10-09 LAB — MRSA PCR SCREENING: MRSA BY PCR: NEGATIVE

## 2016-10-09 LAB — TYPE AND SCREEN
ABO/RH(D): B POS
Antibody Screen: NEGATIVE

## 2016-10-09 MED ORDER — PHENYLEPHRINE HCL 10 MG/ML IJ SOLN
30.0000 ug/min | INTRAMUSCULAR | Status: DC
  Filled 2016-10-09 (×2): qty 2

## 2016-10-09 MED ORDER — CHLORHEXIDINE GLUCONATE CLOTH 2 % EX PADS
6.0000 | MEDICATED_PAD | Freq: Once | CUTANEOUS | Status: AC
  Administered 2016-10-10: 05:00:00 6 via TOPICAL

## 2016-10-09 MED ORDER — VANCOMYCIN HCL 10 G IV SOLR
1500.0000 mg | INTRAVENOUS | Status: AC
  Administered 2016-10-10: 1500 mg via INTRAVENOUS
  Filled 2016-10-09 (×2): qty 1500

## 2016-10-09 MED ORDER — CHLORHEXIDINE GLUCONATE 0.12 % MT SOLN
15.0000 mL | Freq: Once | OROMUCOSAL | Status: AC
  Administered 2016-10-10: 15 mL via OROMUCOSAL
  Filled 2016-10-09: qty 15

## 2016-10-09 MED ORDER — INSULIN REGULAR HUMAN 100 UNIT/ML IJ SOLN
INTRAMUSCULAR | Status: DC
  Filled 2016-10-09 (×2): qty 2.5

## 2016-10-09 MED ORDER — ACTIVE PARTNERSHIP FOR HEALTH OF YOUR HEART BOOK
Freq: Once | Status: AC
  Administered 2016-10-09: 16:00:00
  Filled 2016-10-09: qty 1

## 2016-10-09 MED ORDER — CEFUROXIME SODIUM 750 MG IJ SOLR
750.0000 mg | INTRAMUSCULAR | Status: DC
  Filled 2016-10-09 (×2): qty 750

## 2016-10-09 MED ORDER — METOPROLOL TARTRATE 12.5 MG HALF TABLET: 12.5000 mg | ORAL_TABLET | Freq: Once | ORAL | Status: DC

## 2016-10-09 MED ORDER — DIAZEPAM 5 MG PO TABS
5.0000 mg | ORAL_TABLET | Freq: Once | ORAL | Status: AC
  Administered 2016-10-10: 05:00:00 5 mg via ORAL
  Filled 2016-10-09: qty 1

## 2016-10-09 MED ORDER — MAGNESIUM SULFATE 50 % IJ SOLN
40.0000 meq | INTRAMUSCULAR | Status: DC
  Filled 2016-10-09 (×2): qty 10

## 2016-10-09 MED ORDER — ALBUTEROL SULFATE (2.5 MG/3ML) 0.083% IN NEBU
2.5000 mg | INHALATION_SOLUTION | Freq: Once | RESPIRATORY_TRACT | Status: AC
  Administered 2016-10-09: 08:00:00 2.5 mg via RESPIRATORY_TRACT

## 2016-10-09 MED ORDER — CHLORHEXIDINE GLUCONATE 4 % EX LIQD
CUTANEOUS | Status: AC
  Filled 2016-10-09: qty 15

## 2016-10-09 MED ORDER — CHLORHEXIDINE GLUCONATE CLOTH 2 % EX PADS
6.0000 | MEDICATED_PAD | Freq: Once | CUTANEOUS | Status: AC
  Administered 2016-10-09: 6 via TOPICAL

## 2016-10-09 MED ORDER — PLASMA-LYTE 148 IV SOLN
INTRAVENOUS | Status: AC
  Administered 2016-10-10: 500 mL
  Filled 2016-10-09 (×2): qty 2.5

## 2016-10-09 MED ORDER — TRANEXAMIC ACID (OHS) BOLUS VIA INFUSION
15.0000 mg/kg | INTRAVENOUS | Status: DC
  Filled 2016-10-09 (×2): qty 1395

## 2016-10-09 MED ORDER — DEXTROSE 5 % IV SOLN
1.5000 g | INTRAVENOUS | Status: AC
  Administered 2016-10-10: 1.5 g via INTRAVENOUS
  Administered 2016-10-10: .75 g via INTRAVENOUS
  Filled 2016-10-09 (×2): qty 1.5

## 2016-10-09 MED ORDER — TRANEXAMIC ACID (OHS) PUMP PRIME SOLUTION
2.0000 mg/kg | INTRAVENOUS | Status: DC
  Filled 2016-10-09 (×2): qty 1.86

## 2016-10-09 MED ORDER — NITROGLYCERIN IN D5W 200-5 MCG/ML-% IV SOLN
2.0000 ug/min | INTRAVENOUS | Status: AC
  Administered 2016-10-10: 5 ug/min via INTRAVENOUS
  Filled 2016-10-09 (×2): qty 250

## 2016-10-09 MED ORDER — TRANEXAMIC ACID 1000 MG/10ML IV SOLN
1.5000 mg/kg/h | INTRAVENOUS | Status: AC
  Administered 2016-10-10: 1.5 mg/kg/h via INTRAVENOUS
  Filled 2016-10-09 (×2): qty 25

## 2016-10-09 MED ORDER — DEXMEDETOMIDINE HCL IN NACL 400 MCG/100ML IV SOLN
0.1000 ug/kg/h | INTRAVENOUS | Status: DC
  Filled 2016-10-09 (×2): qty 100

## 2016-10-09 MED ORDER — DOPAMINE-DEXTROSE 3.2-5 MG/ML-% IV SOLN
0.0000 ug/kg/min | INTRAVENOUS | Status: DC
  Filled 2016-10-09 (×2): qty 250

## 2016-10-09 MED ORDER — POTASSIUM CHLORIDE 2 MEQ/ML IV SOLN
80.0000 meq | INTRAVENOUS | Status: DC
  Filled 2016-10-09 (×2): qty 40

## 2016-10-09 MED ORDER — HEPARIN SODIUM (PORCINE) 1000 UNIT/ML IJ SOLN
INTRAMUSCULAR | Status: DC
  Filled 2016-10-09 (×2): qty 30

## 2016-10-09 MED ORDER — EPINEPHRINE PF 1 MG/ML IJ SOLN
0.0000 ug/min | INTRAMUSCULAR | Status: DC
Start: 2016-10-10 — End: 2016-10-10
  Filled 2016-10-09 (×2): qty 4

## 2016-10-09 MED ORDER — LORAZEPAM 0.5 MG PO TABS: 0.5000 mg | ORAL_TABLET | ORAL | Status: DC | PRN

## 2016-10-09 MED ORDER — TEMAZEPAM 15 MG PO CAPS
15.0000 mg | ORAL_CAPSULE | Freq: Once | ORAL | Status: AC | PRN
  Administered 2016-10-09: 15 mg via ORAL
  Filled 2016-10-09: qty 1

## 2016-10-09 MED ORDER — MOVING RIGHT ALONG BOOK
Freq: Once | Status: AC
  Administered 2016-10-09: 12:00:00
  Filled 2016-10-09: qty 1

## 2016-10-09 MED ORDER — BISACODYL 5 MG PO TBEC
5.0000 mg | DELAYED_RELEASE_TABLET | Freq: Once | ORAL | Status: AC
  Administered 2016-10-09: 23:00:00 5 mg via ORAL
  Filled 2016-10-09: qty 1

## 2016-10-09 NOTE — Consult Note (Signed)
Reason for Consult:3 vessel CAD Referring Physician: Dr. Ross  Joseph Jimenez is an 60 y.o. male.  HPI: 60 yo man with a history of hyperlipidemia, nephrolithiasis, heart murmur and prostate cancer and a strong family history(maternal) of CAD. He presents with a cc/o CP. The first episode was last summer while playing golf. Described as a dull aching pain in mid chest. Lasted 20 minutes, but was able to finish his round. He has had 4 additional episodes since then. Most recent was while playing paddle tennis. Lasted about 5 minutes and resolved with rest. He saw Dr. Ross. A stress test was stopped early after 6 minutes due to ST changes and ventricular ectopy. He did not have pain.  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  Past Medical History:  Diagnosis Date  . Coronary artery disease   . Heart murmur    "I've had it my whole life" (10/08/2016)  . High cholesterol   . History of kidney stones    "passed it" (10/08/2016)  . Prostate cancer (HCC) 2005    Past Surgical History:  Procedure Laterality Date  . CARDIAC CATHETERIZATION  10/08/2016  . CARDIAC CATHETERIZATION N/A 10/08/2016   Procedure: Left Heart Cath and Coronary Angiography;  Surgeon: Henry W Smith, MD;  Location: [facility];  Service: Cardiovascular;  Laterality: N/A;  . COLONOSCOPY WITH PROPOFOL N/A 01/29/2016   Procedure: COLONOSCOPY WITH PROPOFOL;  Surgeon: Martin K Johnson, MD;  Location: [facility];  Service: Endoscopy;  Laterality: N/A;  . INGUINAL HERNIA REPAIR Bilateral 1958   "before I was 6 months old ="  . PROSTATE BIOPSY  2005  . PROSTATECTOMY  2005    Family History  Problem Relation Age of Onset  . CAD Mother   . Obesity Brother     Social History:  reports that he quit smoking about 18 years ago. His smoking use included Cigarettes. He has a 20.00 pack-year smoking history. He has never used smokeless tobacco. He reports that he drinks  alcohol. He reports that he uses drugs.  Allergies: No Known Allergies  Medications:  Prior to Admission:  Prescriptions Prior to Admission  Medication Sig Dispense Refill Last Dose  . aspirin 81 MG chewable tablet Chew 81 mg by mouth daily.   10/07/2016 at Unknown time  . nitroGLYCERIN (NITROSTAT) 0.4 MG SL tablet Place 1 tablet (0.4 mg total) under the tongue every 5 (five) minutes as needed. 25 tablet 3 Unknown at Unknown    Results for orders placed or performed during the hospital encounter of 10/08/16 (from the past 48 hour(s))  Basic metabolic panel     Status: None   Collection Time: 10/08/16 12:28 PM  Result Value Ref Range   Sodium 136 135 - 145 mmol/L   Potassium 4.1 3.5 - 5.1 mmol/L   Chloride 103 101 - 111 mmol/L   CO2 25 22 - 32 mmol/L   Glucose, Bld 94 65 - 99 mg/dL   BUN 13 6 - 20 mg/dL   Creatinine, Ser 0.88 0.61 - 1.24 mg/dL   Calcium 9.1 8.9 - 10.3 mg/dL   GFR calc non Af Amer >60 >60 mL/min   GFR calc Af Amer >60 >60 mL/min    Comment: (NOTE) The eGFR has been calculated using the CKD EPI equation. This calculation has not been validated in all clinical situations. eGFR's persistently <60 mL/min signify possible Chronic Kidney Disease.    Anion gap 8 5 -   15  Protime-INR     Status: None   Collection Time: 10/08/16 12:28 PM  Result Value Ref Range   Prothrombin Time 12.5 11.4 - 15.2 seconds   INR 0.93   MRSA PCR Screening     Status: None   Collection Time: 10/08/16 10:24 PM  Result Value Ref Range   MRSA by PCR NEGATIVE NEGATIVE    Comment:        The GeneXpert MRSA Assay (FDA approved for NASAL specimens only), is one component of a comprehensive MRSA colonization surveillance program. It is not intended to diagnose MRSA infection nor to guide or monitor treatment for MRSA infections.   Basic metabolic panel     Status: Abnormal   Collection Time: 10/09/16  2:58 AM  Result Value Ref Range   Sodium 136 135 - 145 mmol/L   Potassium 3.7 3.5 -  5.1 mmol/L   Chloride 103 101 - 111 mmol/L   CO2 24 22 - 32 mmol/L   Glucose, Bld 101 (H) 65 - 99 mg/dL   BUN 13 6 - 20 mg/dL   Creatinine, Ser 0.83 0.61 - 1.24 mg/dL   Calcium 8.7 (L) 8.9 - 10.3 mg/dL   GFR calc non Af Amer >60 >60 mL/min   GFR calc Af Amer >60 >60 mL/min    Comment: (NOTE) The eGFR has been calculated using the CKD EPI equation. This calculation has not been validated in all clinical situations. eGFR's persistently <60 mL/min signify possible Chronic Kidney Disease.    Anion gap 9 5 - 15  CBC     Status: None   Collection Time: 10/09/16  2:58 AM  Result Value Ref Range   WBC 9.2 4.0 - 10.5 K/uL   RBC 5.31 4.22 - 5.81 MIL/uL   Hemoglobin 15.7 13.0 - 17.0 g/dL   HCT 45.3 39.0 - 52.0 %   MCV 85.3 78.0 - 100.0 fL   MCH 29.6 26.0 - 34.0 pg   MCHC 34.7 30.0 - 36.0 g/dL   RDW 13.0 11.5 - 15.5 %   Platelets 177 150 - 400 K/uL    No results found.  Review of Systems  Constitutional: Negative for chills, fever and malaise/fatigue.  Eyes: Negative for blurred vision and double vision.  Respiratory: Negative for cough, shortness of breath and wheezing.   Cardiovascular: Positive for chest pain. Negative for palpitations, orthopnea, claudication, leg swelling and PND.  Gastrointestinal: Negative for abdominal pain, nausea and vomiting.  Genitourinary: Negative for dysuria.       Prostatectomy  Musculoskeletal: Negative for back pain and myalgias.  Neurological: Negative for focal weakness, seizures, loss of consciousness and weakness.  Endo/Heme/Allergies: Does not bruise/bleed easily.  All other systems reviewed and are negative.  Blood pressure 126/75, pulse (!) 59, temperature 98 F (36.7 C), temperature source Oral, resp. rate 17, height _0  (1.803 m), weight 205 lb 0.4 oz (93 kg), SpO2 96 %. Physical Exam  Vitals reviewed. Constitutional: He is oriented to person, place, and time. He appears well-developed and well-nourished. No distress.  HENT:  Head:  Normocephalic and atraumatic.  Mouth/Throat: No oropharyngeal exudate.  Eyes: Conjunctivae and EOM are normal. Pupils are equal, round, and reactive to light. No scleral icterus.  Neck: Neck supple. No thyromegaly present.  Cardiovascular: Normal rate, regular rhythm and intact distal pulses.   Murmur (2/6 systolic) heard. Normal Allen's test on left  Respiratory: Effort normal and breath sounds normal. No respiratory distress. He has no wheezes. He has no rales.  GI: Soft. Bowel sounds are normal. He exhibits no distension. There is no tenderness.  Musculoskeletal: Normal range of motion. He exhibits no edema.  Lymphadenopathy:    He has no cervical adenopathy.  Neurological: He is alert and oriented to person, place, and time. No cranial nerve deficit.  No motor deficit  Skin: Skin is warm and dry.  Psychiatric: He has a normal mood and affect.   Cardiac Catheterization Conclusion     LV end diastolic pressure is normal.  The left ventricular systolic function is normal.  The left ventricular ejection fraction is 55-65% by visual estimate.  Prox RCA lesion, 90 %stenosed.  Mid RCA lesion, 80 %stenosed.  1st Mrg lesion, 90 %stenosed.  Mid Cx lesion, 85 %stenosed.  Prox Cx to Mid Cx lesion, 40 %stenosed.  Ost 1st Diag lesion, 90 %stenosed.  Prox LAD lesion, 95 %stenosed.  Mid LAD lesion, 50 %stenosed.  Ost 2nd Diag lesion, 80 %stenosed.    Severe three-vessel coronary disease involving proximal left anterior descending and diagonal bifurcation stenosis, [place] 1, 1, 1. Moderate mid LAD stenosis at bifurcation with the second diagonal which contains 60-70% stenosis.  90% obstruction in the bifurcating first obtuse marginal. Eccentric 70-80% stenosis in the mid circumflex beyond the second obtuse marginal seen best in the LAO and LAO caudal views.  Tandem 90% stenoses in the nondominant right coronary  Normal left ventricular size and  function.  RECOMMENDATIONS:   Given severity and complexity of disease, coronary bypass grafting is the recommended therapy for symptom control and enhanced longevity.  Given the early positive exercise treadmill test and absence of symptoms (silent ischemia) associated with high-grade ventricular ectopy, I believe it is best that the patient remain hospitalized.  Beta blocker therapy.  Therapeutic anticoagulation for ACS    I personally reviewed the cath films and concur with the findings noted above  Assessment/Plan: 60 yo man with a strong family history of CAD and hyperlipidemia presents with exertional angina. At cath has severe 3 vessel CAD with preserved LV function. CABG indicated for survival benefit and relief of symptoms.  I discussed with the patient the general nature of the procedure, the need for general anesthesia, the use of cardiopulmonary bypass, and the incisions to be used. We discussed the use of the left radial artery. We discussed the expected hospital stay, overall recovery and short and long term outcomes. I informed him of the indications, risks, benefits and alternatives. He understands the risks include, but are not limited to death, stroke, MI, DVT/PE, bleeding, possible need for transfusion, infections, cardiac arrhythmias, as well as other organ system dysfunction including respiratory, renal, or GI complications.   He accepts the risks and agrees to proceed  For CABG with left radial artery in AM Melrose Nakayama 10/09/2016, 6:18 PM

## 2016-10-09 NOTE — Progress Notes (Signed)
Pre-op Cardiac Surgery  Carotid Duplex performed 10/08/16.  Upper Extremity Right Left  Brachial Pressures 121 123  Radial Waveforms Tri Tri  Ulnar Waveforms Tri Tri  Palmar Arch (Allen's Test) Decreases >50% with radial compression, obliterates with ulnar compression Normal    Findings:  Pedal artery waveforms within normal limits.   Landry Mellow, RDMS, RVT 10/09/2016

## 2016-10-09 NOTE — Care Management Note (Signed)
Case Management Note  Patient Details  Name: Joseph Jimenez MRN: [id number] Date of Birth: 1956-02-23  Subjective/Objective:   Has 3 vessel disease for CABG tomorrow.  PTA indep,   NCM will cont to follow for dc needs.                 Action/Plan:   Expected Discharge Date:                  Expected Discharge Plan:  Home/Self Care  In-House Referral:     Discharge planning Services  CM Consult  Post Acute Care Choice:    Choice offered to:     DME Arranged:    DME Agency:     HH Arranged:    HH Agency:     Status of Service:  In process, will continue to follow  If discussed at Long Length of Stay Meetings, dates discussed:    Additional Comments:  Zenon Mayo, RN 10/09/2016, 2:34 PM

## 2016-10-09 NOTE — Progress Notes (Signed)
CARDIAC REHAB PHASE I   PRE:  Rate/Rhythm: 77 SR  BP:  Supine:   Sitting: 137/82  Standing:    SaO2:   MODE:  Ambulation: 500 ft   POST:  Rate/Rhythm: 88 SR  BP:  Supine:   Sitting: 180/83  Standing:    SaO2:  0910-0950 Pt walked 500 ft with steady gait and no CP. BP elevated afterwards so encouraged to just walk in room. Discussed sternal precautions, IS which he demonstrated correctly to top, and importance of mobility and IS after surgery. Has seen pre op video already. Gave pt [facility] booklet and care guide. Will have family available 24/7 first week home.    Graylon Good, RN BSN  10/09/2016 9:47 AM

## 2016-10-09 NOTE — Progress Notes (Signed)
Patient Name: Joseph Jimenez Date of Encounter: 10/09/2016  Primary Cardiologist: Dr. Ladonna Snide Problem List     Active Problems:   Unstable angina pectoris [facility])   Coronary artery disease involving native coronary artery of native heart with unstable angina pectoris [facility])   Other hyperlipidemia   Family history of early CAD   Unstable angina [facility])    Patient Profile     60 y/o male, admitted for LHC, after positive exercise treadmill test in office. Stress test was aborted after high-grade ventricular ectopy noted on stress EKGs. Patient was asymptomatic and believed to have silent ischemia. LHC 10/08/16 showed severe 3VD. Now awaiting CABG.    Subjective   Doing well. CP free. No dyspnea. Radial cath site is stable.   Inpatient Medications    Scheduled Meds: . aspirin  81 mg Oral Daily  . atorvastatin  80 mg Oral q1800  . enoxaparin (LOVENOX) injection  90 mg Subcutaneous Q12H  . metoprolol tartrate  12.5 mg Oral BID  . moving right along book   Does not apply Once  . sodium chloride flush  3 mL Intravenous Q12H   Continuous Infusions:  PRN Meds: sodium chloride, acetaminophen, ondansetron (ZOFRAN) IV, oxyCODONE-acetaminophen, sodium chloride flush   Vital Signs    Vitals:   10/08/16 2000 10/08/16 2159 10/09/16 0526 10/09/16 0747  BP: (!) 152/65 131/79 119/66 132/80  Pulse: 63 92 (!) 54 67  Resp: 20  16 16   Temp:   98.1 F (36.7 C) 98 F (36.7 C)  TempSrc:   Oral Oral  SpO2: 96%  96% 96%  Weight:   205 lb 0.4 oz (93 kg)   Height:        Intake/Output Summary (Last 24 hours) at 10/09/16 1036 Last data filed at 10/09/16 0000  Gross per 24 hour  Intake            735.6 ml  Output              376 ml  Net            359.6 ml   Filed Weights   10/08/16 1116 10/09/16 0526  Weight: 207 lb (93.9 kg) 205 lb 0.4 oz (93 kg)    Physical Exam   GEN: Well nourished, well developed, in no acute distress.  HEENT: Grossly normal.  Neck: Supple, no  JVD, carotid bruits, or masses. Cardiac: RRR, no murmurs, rubs, or gallops. No clubbing, cyanosis, edema.  Radials/DP/PT 2+ and equal bilaterally.  Respiratory:  Respirations regular and unlabored, clear to auscultation bilaterally. GI: Soft, nontender, nondistended, BS + x 4. MS: no deformity or atrophy. Skin: warm and dry, no rash. Neuro:  Strength and sensation are intact. Psych: AAOx3.  Normal affect.  Labs    CBC  Recent Labs  10/07/16 0816 10/09/16 0258  WBC 7.8 9.2  NEUTROABS 4,680  --   HGB 16.2 15.7  HCT 46.8 45.3  MCV 87.0 85.3  PLT 188 [id number]   Basic Metabolic Panel  Recent Labs  10/08/16 1228 10/09/16 0258  NA 136 136  K 4.1 3.7  CL 103 103  CO2 25 24  GLUCOSE 94 101*  BUN 13 13  CREATININE 0.88 0.83  CALCIUM 9.1 8.7*   Liver Function Tests No results for input(s): AST, ALT, ALKPHOS, BILITOT, PROT, ALBUMIN in the last 72 hours. No results for input(s): LIPASE, AMYLASE in the last 72 hours. Cardiac Enzymes No results for input(s): CKTOTAL, CKMB, CKMBINDEX, TROPONINI  in the last 72 hours. BNP Invalid input(s): POCBNP D-Dimer No results for input(s): DDIMER in the last 72 hours. Hemoglobin A1C No results for input(s): HGBA1C in the last 72 hours. Fasting Lipid Panel  Recent Labs  10/07/16 0816  CHOL 244*  HDL 57  LDLCALC 166*  TRIG 106  CHOLHDL 4.3   Thyroid Function Tests No results for input(s): TSH, T4TOTAL, T3FREE, THYROIDAB in the last 72 hours.  Invalid input(s): FREET3  Telemetry    NSR- Personally Reviewed   Radiology    No results found.  Cardiac Studies  Procedures   Left Heart Cath and Coronary Angiography  Conclusion     LV end diastolic pressure is normal.  The left ventricular systolic function is normal.  The left ventricular ejection fraction is 55-65% by visual estimate.  Prox RCA lesion, 90 %stenosed.  Mid RCA lesion, 80 %stenosed.  1st Mrg lesion, 90 %stenosed.  Mid Cx lesion, 85  %stenosed.  Prox Cx to Mid Cx lesion, 40 %stenosed.  Ost 1st Diag lesion, 90 %stenosed.  Prox LAD lesion, 95 %stenosed.  Mid LAD lesion, 50 %stenosed.  Ost 2nd Diag lesion, 80 %stenosed.    Severe three-vessel coronary disease involving proximal left anterior descending and diagonal bifurcation stenosis, [place] 1, 1, 1. Moderate mid LAD stenosis at bifurcation with the second diagonal which contains 60-70% stenosis.  90% obstruction in the bifurcating first obtuse marginal. Eccentric 70-80% stenosis in the mid circumflex beyond the second obtuse marginal seen best in the LAO and LAO caudal views.  Tandem 90% stenoses in the nondominant right coronary  Normal left ventricular size and function.    Patient Profile     60 y/o male, admitted for LHC, after positive exercise treadmill test in office. Stress test was aborted after high-grade ventricular ectopy noted on stress EKGs. Patient was asymptomatic and believed to have silent ischemia. LHC 10/08/16 showed severe 3VD. Awaiting CABG.   Assessment & Plan    1. CAD: severe 3VD noted on cath yesterday. Angiographic details outlined above. CABG recommended. Pt. States he was seen by Dr. Roxan Hockey last PM. Consult note from Dr. Roxan Hockey pending. He is tentatively scheduled for CABG tomorrow. He is CP free. NSR on telemetry. No dyspnea nor palpitations. He is not on IV heparin but appears to be on Lovenox. On ASA, statin and BB. He completed PFTs and other pre-CABG w/u earlier today. Radial cath sight is stable.   Signed, Lyda Jester, PA-C  10/09/2016, 10:36 AM

## 2016-10-09 NOTE — Anesthesia Preprocedure Evaluation (Addendum)
Anesthesia Evaluation  Patient identified by MRN, date of birth, ID band Patient awake    Reviewed: Allergy & Precautions, NPO status , Patient's Chart, lab work & pertinent test results  Airway Mallampati: II  TM Distance: >3 FB Neck ROM: Full    Dental  (+) Dental Advisory Given   Pulmonary former smoker,    breath sounds clear to auscultation       Cardiovascular + angina with exertion + CAD   Rhythm:Regular Rate:Normal  Severe 3v disease with Nl LVEF   Neuro/Psych negative neurological ROS     GI/Hepatic negative GI ROS, Neg liver ROS,   Endo/Other  negative endocrine ROS  Renal/GU negative Renal ROS     Musculoskeletal   Abdominal   Peds  Hematology negative hematology ROS (+)   Anesthesia Other Findings   Reproductive/Obstetrics                            Lab Results  Component Value Date   WBC 9.2 10/09/2016   HGB 15.7 10/09/2016   HCT 45.3 10/09/2016   MCV 85.3 10/09/2016   PLT 177 10/09/2016   Lab Results  Component Value Date   CREATININE 0.83 10/09/2016   BUN 13 10/09/2016   NA 136 10/09/2016   K 3.7 10/09/2016   CL 103 10/09/2016   CO2 24 10/09/2016    Anesthesia Physical Anesthesia Plan  ASA: IV  Anesthesia Plan: General   Post-op Pain Management:    Induction: Intravenous  Airway Management Planned:   Additional Equipment: Arterial line, TEE, PA Cath, Ultrasound Guidance Line Placement and CVP  Intra-op Plan:   Post-operative Plan: Post-operative intubation/ventilation  Informed Consent: I have reviewed the patients History and Physical, chart, labs and discussed the procedure including the risks, benefits and alternatives for the proposed anesthesia with the patient or authorized representative who has indicated his/her understanding and acceptance.   Dental advisory given  Plan Discussed with:   Anesthesia Plan Comments:        Anesthesia  Quick Evaluation

## 2016-10-10 ENCOUNTER — Inpatient Hospital Stay (HOSPITAL_COMMUNITY): Payer: BLUE CROSS/BLUE SHIELD | Admitting: Anesthesiology

## 2016-10-10 ENCOUNTER — Encounter (HOSPITAL_COMMUNITY)
Admission: AD | Disposition: A | Discharge status: Home / Self Care | Payer: Self-pay | Source: Ambulatory Visit | Attending: Thoracic Surgery (Cardiothoracic Vascular Surgery)

## 2016-10-10 ENCOUNTER — Inpatient Hospital Stay (HOSPITAL_COMMUNITY): Payer: BLUE CROSS/BLUE SHIELD

## 2016-10-10 DIAGNOSIS — Z951 Presence of aortocoronary bypass graft: Secondary | ICD-10-CM

## 2016-10-10 HISTORY — PX: CYSTOSCOPY: SHX5120

## 2016-10-10 HISTORY — PX: TEE WITHOUT CARDIOVERSION: SHX5443

## 2016-10-10 HISTORY — PX: CORONARY ARTERY BYPASS GRAFT: SHX141

## 2016-10-10 LAB — CBC
HCT: 37.1 % — ABNORMAL LOW (ref 39.0–52.0)
HCT: 35.1 % — ABNORMAL LOW (ref 39.0–52.0)
HEMATOCRIT: 45.1 % (ref 39.0–52.0)
HEMOGLOBIN: 15.7 g/dL (ref 13.0–17.0)
HEMOGLOBIN: 12.9 g/dL — AB (ref 13.0–17.0)
Hemoglobin: 12.4 g/dL — ABNORMAL LOW (ref 13.0–17.0)
MCH: 29.7 pg (ref 26.0–34.0)
MCH: 29.3 pg (ref 26.0–34.0)
MCH: 29.9 pg (ref 26.0–34.0)
MCHC: 34.8 g/dL (ref 30.0–36.0)
MCHC: 34.8 g/dL (ref 30.0–36.0)
MCHC: 35.3 g/dL (ref 30.0–36.0)
MCV: 85.3 fL (ref 78.0–100.0)
MCV: 84.3 fL (ref 78.0–100.0)
MCV: 84.6 fL (ref 78.0–100.0)
PLATELETS: 192 10*3/uL (ref 150–400)
PLATELETS: 124 10*3/uL — AB (ref 150–400)
PLATELETS: 148 10*3/uL — AB (ref 150–400)
RBC: 5.29 MIL/uL (ref 4.22–5.81)
RBC: 4.4 MIL/uL (ref 4.22–5.81)
RBC: 4.15 MIL/uL — AB (ref 4.22–5.81)
RDW: 13.1 % (ref 11.5–15.5)
RDW: 12.8 % (ref 11.5–15.5)
RDW: 12.9 % (ref 11.5–15.5)
WBC: 9.2 10*3/uL (ref 4.0–10.5)
WBC: 18.9 10*3/uL — AB (ref 4.0–10.5)
WBC: 21 10*3/uL — ABNORMAL HIGH (ref 4.0–10.5)

## 2016-10-10 LAB — POCT I-STAT, CHEM 8
BUN: 12 mg/dL (ref 6–20)
BUN: 10 mg/dL (ref 6–20)
BUN: 11 mg/dL (ref 6–20)
BUN: 11 mg/dL (ref 6–20)
BUN: 12 mg/dL (ref 6–20)
BUN: 14 mg/dL (ref 6–20)
CALCIUM ION: 1.12 mmol/L — AB (ref 1.15–1.40)
CALCIUM ION: 1.01 mmol/L — AB (ref 1.15–1.40)
CALCIUM ION: 1.15 mmol/L (ref 1.15–1.40)
CALCIUM ION: 1.04 mmol/L — AB (ref 1.15–1.40)
CALCIUM ION: 1.2 mmol/L (ref 1.15–1.40)
CHLORIDE: 103 mmol/L (ref 101–111)
CHLORIDE: 105 mmol/L (ref 101–111)
CHLORIDE: 101 mmol/L (ref 101–111)
Calcium, Ion: 1.04 mmol/L — ABNORMAL LOW (ref 1.15–1.40)
Chloride: 101 mmol/L (ref 101–111)
Chloride: 102 mmol/L (ref 101–111)
Chloride: 107 mmol/L (ref 101–111)
Creatinine, Ser: 0.8 mg/dL (ref 0.61–1.24)
Creatinine, Ser: 0.5 mg/dL — ABNORMAL LOW (ref 0.61–1.24)
Creatinine, Ser: 0.6 mg/dL — ABNORMAL LOW (ref 0.61–1.24)
Creatinine, Ser: 0.6 mg/dL — ABNORMAL LOW (ref 0.61–1.24)
Creatinine, Ser: 0.7 mg/dL (ref 0.61–1.24)
Creatinine, Ser: 0.9 mg/dL (ref 0.61–1.24)
GLUCOSE: 102 mg/dL — AB (ref 65–99)
GLUCOSE: 118 mg/dL — AB (ref 65–99)
GLUCOSE: 127 mg/dL — AB (ref 65–99)
Glucose, Bld: 111 mg/dL — ABNORMAL HIGH (ref 65–99)
Glucose, Bld: 156 mg/dL — ABNORMAL HIGH (ref 65–99)
Glucose, Bld: 153 mg/dL — ABNORMAL HIGH (ref 65–99)
HCT: 38 % — ABNORMAL LOW (ref 39.0–52.0)
HCT: 33 % — ABNORMAL LOW (ref 39.0–52.0)
HCT: 40 % (ref 39.0–52.0)
HCT: 32 % — ABNORMAL LOW (ref 39.0–52.0)
HCT: 33 % — ABNORMAL LOW (ref 39.0–52.0)
HEMATOCRIT: 31 % — AB (ref 39.0–52.0)
HEMOGLOBIN: 13.6 g/dL (ref 13.0–17.0)
HEMOGLOBIN: 10.9 g/dL — AB (ref 13.0–17.0)
HEMOGLOBIN: 10.5 g/dL — AB (ref 13.0–17.0)
HEMOGLOBIN: 11.2 g/dL — AB (ref 13.0–17.0)
Hemoglobin: 12.9 g/dL — ABNORMAL LOW (ref 13.0–17.0)
Hemoglobin: 11.2 g/dL — ABNORMAL LOW (ref 13.0–17.0)
POTASSIUM: 4.4 mmol/L (ref 3.5–5.1)
Potassium: 3.9 mmol/L (ref 3.5–5.1)
Potassium: 4.1 mmol/L (ref 3.5–5.1)
Potassium: 5.3 mmol/L — ABNORMAL HIGH (ref 3.5–5.1)
Potassium: 4.7 mmol/L (ref 3.5–5.1)
Potassium: 4.4 mmol/L (ref 3.5–5.1)
SODIUM: 141 mmol/L (ref 135–145)
SODIUM: 133 mmol/L — AB (ref 135–145)
SODIUM: 137 mmol/L (ref 135–145)
Sodium: 138 mmol/L (ref 135–145)
Sodium: 141 mmol/L (ref 135–145)
Sodium: 138 mmol/L (ref 135–145)
TCO2: 25 mmol/L (ref 0–100)
TCO2: 26 mmol/L (ref 0–100)
TCO2: 28 mmol/L (ref 0–100)
TCO2: 26 mmol/L (ref 0–100)
TCO2: 23 mmol/L (ref 0–100)
TCO2: 24 mmol/L (ref 0–100)

## 2016-10-10 LAB — GLUCOSE, CAPILLARY
GLUCOSE-CAPILLARY: 116 mg/dL — AB (ref 65–99)
GLUCOSE-CAPILLARY: 116 mg/dL — AB (ref 65–99)
GLUCOSE-CAPILLARY: 120 mg/dL — AB (ref 65–99)
GLUCOSE-CAPILLARY: 128 mg/dL — AB (ref 65–99)
Glucose-Capillary: 118 mg/dL — ABNORMAL HIGH (ref 65–99)
Glucose-Capillary: 134 mg/dL — ABNORMAL HIGH (ref 65–99)
Glucose-Capillary: 101 mg/dL — ABNORMAL HIGH (ref 65–99)

## 2016-10-10 LAB — APTT
APTT: 36 s (ref 24–36)
aPTT: 36 seconds (ref 24–36)

## 2016-10-10 LAB — BASIC METABOLIC PANEL
ANION GAP: 4 — AB (ref 5–15)
BUN: 13 mg/dL (ref 6–20)
CHLORIDE: 103 mmol/L (ref 101–111)
CO2: 32 mmol/L (ref 22–32)
Calcium: 9.1 mg/dL (ref 8.9–10.3)
Creatinine, Ser: 1.01 mg/dL (ref 0.61–1.24)
GFR calc Af Amer: >60 mL/min (ref >60)
GLUCOSE: 120 mg/dL — AB (ref 65–99)
POTASSIUM: 4.1 mmol/L (ref 3.5–5.1)
Sodium: 139 mmol/L (ref 135–145)

## 2016-10-10 LAB — POCT I-STAT 3, ART BLOOD GAS (G3+)
Acid-Base Excess: 4 mmol/L — ABNORMAL HIGH (ref 0.0–2.0)
Acid-base deficit: 1 mmol/L (ref 0.0–2.0)
Bicarbonate: 29.2 mmol/L — ABNORMAL HIGH (ref 20.0–28.0)
Bicarbonate: 23.9 mmol/L (ref 20.0–28.0)
O2 SAT: 100 %
O2 SAT: 98 %
PCO2 ART: 43.3 mmHg (ref 32.0–48.0)
TCO2: 30 mmol/L (ref 0–100)
TCO2: 25 mmol/L (ref 0–100)
pCO2 arterial: 39.7 mmHg (ref 32.0–48.0)
pH, Arterial: 7.436 (ref 7.350–7.450)
pH, Arterial: 7.388 (ref 7.350–7.450)
pO2, Arterial: 317 mmHg — ABNORMAL HIGH (ref 83.0–108.0)
pO2, Arterial: 100 mmHg (ref 83.0–108.0)

## 2016-10-10 LAB — BLOOD GAS, ARTERIAL
ACID-BASE EXCESS: 2.7 mmol/L — AB (ref 0.0–2.0)
BICARBONATE: 26.8 mmol/L (ref 20.0–28.0)
DRAWN BY: 441661
FIO2: 0.21
O2 SAT: 96.2 %
PCO2 ART: 42 mmHg (ref 32.0–48.0)
Patient temperature: 98.6
pH, Arterial: 7.421 (ref 7.350–7.450)
pO2, Arterial: 83.7 mmHg (ref 83.0–108.0)

## 2016-10-10 LAB — URINALYSIS, ROUTINE W REFLEX MICROSCOPIC
BILIRUBIN URINE: NEGATIVE
GLUCOSE, UA: NEGATIVE mg/dL
HGB URINE DIPSTICK: NEGATIVE
Ketones, ur: NEGATIVE mg/dL
Leukocytes, UA: NEGATIVE
Nitrite: NEGATIVE
Protein, ur: NEGATIVE mg/dL
SPECIFIC GRAVITY, URINE: 1.014 (ref 1.005–1.030)
pH: 5.5 (ref 5.0–8.0)

## 2016-10-10 LAB — POCT I-STAT 4, (NA,K, GLUC, HGB,HCT)
Glucose, Bld: 120 mg/dL — ABNORMAL HIGH (ref 65–99)
HEMATOCRIT: 35 % — AB (ref 39.0–52.0)
HEMOGLOBIN: 11.9 g/dL — AB (ref 13.0–17.0)
Potassium: 3.9 mmol/L (ref 3.5–5.1)
SODIUM: 141 mmol/L (ref 135–145)

## 2016-10-10 LAB — HEMOGLOBIN AND HEMATOCRIT, BLOOD
HEMATOCRIT: 32.9 % — AB (ref 39.0–52.0)
HEMOGLOBIN: 11.7 g/dL — AB (ref 13.0–17.0)

## 2016-10-10 LAB — EXERCISE TOLERANCE TEST
CHL CUP MPHR: 161 {beats}/min
CHL CUP RESTING HR STRESS: 70 {beats}/min
Estimated workload: 7.7 METS
Exercise duration (min): 6 min
Exercise duration (sec): 26 s
Peak HR: 131 {beats}/min
Percent HR: 81 %

## 2016-10-10 LAB — MAGNESIUM: MAGNESIUM: 2.8 mg/dL — AB (ref 1.7–2.4)

## 2016-10-10 LAB — CREATININE, SERUM
CREATININE: 0.89 mg/dL (ref 0.61–1.24)
GFR calc Af Amer: >60 mL/min (ref >60)
GFR calc non Af Amer: >60 mL/min (ref >60)

## 2016-10-10 LAB — PROTIME-INR
INR: 1.36
PROTHROMBIN TIME: 16.9 s — AB (ref 11.4–15.2)

## 2016-10-10 LAB — PLATELET COUNT: Platelets: 159 10*3/uL (ref 150–400)

## 2016-10-10 SURGERY — CORONARY ARTERY BYPASS GRAFTING (CABG)
Anesthesia: General | Site: Chest

## 2016-10-10 MED ORDER — DEXTROSE 5 % IV SOLN
INTRAVENOUS | Status: DC | PRN
  Administered 2016-10-10: 25 ug/min via INTRAVENOUS

## 2016-10-10 MED ORDER — ONDANSETRON HCL 4 MG/2ML IJ SOLN: 4.0000 mg | Freq: Four times a day (QID) | INTRAMUSCULAR | Status: DC | PRN

## 2016-10-10 MED ORDER — PROTAMINE SULFATE 10 MG/ML IV SOLN
INTRAVENOUS | Status: AC
  Filled 2016-10-10: qty 25

## 2016-10-10 MED ORDER — SODIUM CHLORIDE 0.9 % IJ SOLN
OROMUCOSAL | Status: DC | PRN
  Administered 2016-10-10 (×3): 1 mL via TOPICAL

## 2016-10-10 MED ORDER — ALBUMIN HUMAN 5 % IV SOLN
250.0000 mL | INTRAVENOUS | Status: AC | PRN
  Administered 2016-10-10 (×2): 250 mL via INTRAVENOUS
  Administered 2016-10-10: 16:00:00 via INTRAVENOUS
  Filled 2016-10-10: qty 250

## 2016-10-10 MED ORDER — ASPIRIN EC 325 MG PO TBEC
325.0000 mg | DELAYED_RELEASE_TABLET | Freq: Every day | ORAL | Status: DC
  Administered 2016-10-11 – 2016-10-13 (×3): 325 mg via ORAL
  Filled 2016-10-10 (×3): qty 1

## 2016-10-10 MED ORDER — DEXTROSE 5 % IV SOLN
1.5000 g | Freq: Two times a day (BID) | INTRAVENOUS | Status: AC
  Administered 2016-10-10 – 2016-10-12 (×4): 1.5 g via INTRAVENOUS
  Filled 2016-10-10 (×4): qty 1.5

## 2016-10-10 MED ORDER — ORAL CARE MOUTH RINSE
15.0000 mL | Freq: Two times a day (BID) | OROMUCOSAL | Status: DC
  Administered 2016-10-11 – 2016-10-12 (×3): 15 mL via OROMUCOSAL

## 2016-10-10 MED ORDER — SODIUM CHLORIDE 0.9 % IV SOLN
INTRAVENOUS | Status: DC | PRN
  Administered 2016-10-10: 15:00:00 via INTRAVENOUS

## 2016-10-10 MED ORDER — MAGNESIUM SULFATE 4 GM/100ML IV SOLN
4.0000 g | Freq: Once | INTRAVENOUS | Status: AC
  Administered 2016-10-10: 4 g via INTRAVENOUS
  Filled 2016-10-10: qty 100

## 2016-10-10 MED ORDER — SODIUM CHLORIDE 0.9% FLUSH
3.0000 mL | Freq: Two times a day (BID) | INTRAVENOUS | Status: DC
  Administered 2016-10-11 – 2016-10-12 (×3): 3 mL via INTRAVENOUS

## 2016-10-10 MED ORDER — METOPROLOL TARTRATE 5 MG/5ML IV SOLN: 2.5000 mg | INTRAVENOUS | Status: DC | PRN

## 2016-10-10 MED ORDER — BISACODYL 10 MG RE SUPP: 10.0000 mg | Freq: Every day | RECTAL | Status: DC

## 2016-10-10 MED ORDER — FAMOTIDINE IN NACL 20-0.9 MG/50ML-% IV SOLN
20.0000 mg | Freq: Two times a day (BID) | INTRAVENOUS | Status: AC
  Administered 2016-10-10: 20 mg via INTRAVENOUS

## 2016-10-10 MED ORDER — MIDAZOLAM HCL 10 MG/2ML IJ SOLN
INTRAMUSCULAR | Status: AC
Start: 2016-10-10 — End: 2016-10-10
  Filled 2016-10-10: qty 2

## 2016-10-10 MED ORDER — MIDAZOLAM HCL 2 MG/2ML IJ SOLN: 2.0000 mg | INTRAMUSCULAR | Status: DC | PRN

## 2016-10-10 MED ORDER — SODIUM CHLORIDE 0.9 % IV SOLN: 250.0000 mL | INTRAVENOUS | Status: DC

## 2016-10-10 MED ORDER — FENTANYL CITRATE (PF) 250 MCG/5ML IJ SOLN
INTRAMUSCULAR | Status: AC
  Filled 2016-10-10: qty 5

## 2016-10-10 MED ORDER — PROTAMINE SULFATE 10 MG/ML IV SOLN
INTRAVENOUS | Status: AC
  Filled 2016-10-10: qty 5

## 2016-10-10 MED ORDER — EPHEDRINE SULFATE-NACL 50-0.9 MG/10ML-% IV SOSY
PREFILLED_SYRINGE | INTRAVENOUS | Status: DC | PRN
  Administered 2016-10-10: 5 mg via INTRAVENOUS

## 2016-10-10 MED ORDER — LIDOCAINE 2% (20 MG/ML) 5 ML SYRINGE
INTRAMUSCULAR | Status: AC
  Filled 2016-10-10: qty 5

## 2016-10-10 MED ORDER — PROTAMINE SULFATE 10 MG/ML IV SOLN
INTRAVENOUS | Status: DC | PRN
  Administered 2016-10-10: 300 mg via INTRAVENOUS

## 2016-10-10 MED ORDER — INSULIN ASPART 100 UNIT/ML ~~LOC~~ SOLN
0.0000 [IU] | SUBCUTANEOUS | Status: DC
  Administered 2016-10-11: 2 [IU] via SUBCUTANEOUS

## 2016-10-10 MED ORDER — ROCURONIUM BROMIDE 10 MG/ML (PF) SYRINGE
PREFILLED_SYRINGE | INTRAVENOUS | Status: AC
  Filled 2016-10-10: qty 10

## 2016-10-10 MED ORDER — ARTIFICIAL TEARS OP OINT
TOPICAL_OINTMENT | OPHTHALMIC | Status: AC
  Filled 2016-10-10: qty 3.5

## 2016-10-10 MED ORDER — VANCOMYCIN HCL IN DEXTROSE 1-5 GM/200ML-% IV SOLN
1000.0000 mg | Freq: Once | INTRAVENOUS | Status: AC
  Administered 2016-10-10: 1000 mg via INTRAVENOUS
  Filled 2016-10-10 (×2): qty 200

## 2016-10-10 MED ORDER — LACTATED RINGERS IV SOLN: INTRAVENOUS | Status: DC

## 2016-10-10 MED ORDER — PHENYLEPHRINE HCL 10 MG/ML IJ SOLN
0.0000 ug/min | INTRAVENOUS | Status: DC
  Administered 2016-10-11: 35 ug/min via INTRAVENOUS
  Filled 2016-10-10: qty 2

## 2016-10-10 MED ORDER — SODIUM CHLORIDE 0.9 % IJ SOLN
INTRAMUSCULAR | Status: AC
  Filled 2016-10-10: qty 10

## 2016-10-10 MED ORDER — LACTATED RINGERS IV SOLN: 500.0000 mL | Freq: Once | INTRAVENOUS | Status: DC | PRN

## 2016-10-10 MED ORDER — HEMOSTATIC AGENTS (NO CHARGE) OPTIME
TOPICAL | Status: DC | PRN
  Administered 2016-10-10: 1 via TOPICAL

## 2016-10-10 MED ORDER — METOPROLOL TARTRATE 12.5 MG HALF TABLET
12.5000 mg | ORAL_TABLET | Freq: Two times a day (BID) | ORAL | Status: DC
  Administered 2016-10-11: 12.5 mg via ORAL
  Filled 2016-10-10: qty 1

## 2016-10-10 MED ORDER — VECURONIUM BROMIDE 10 MG IV SOLR
INTRAVENOUS | Status: AC
  Filled 2016-10-10: qty 20

## 2016-10-10 MED ORDER — SODIUM CHLORIDE 0.9 % IV SOLN
INTRAVENOUS | Status: DC
  Administered 2016-10-10: 16:00:00 via INTRAVENOUS

## 2016-10-10 MED ORDER — CALCIUM CHLORIDE 10 % IV SOLN
INTRAVENOUS | Status: DC | PRN
  Administered 2016-10-10: 100 mg via INTRAVENOUS
  Administered 2016-10-10: 200 mg via INTRAVENOUS

## 2016-10-10 MED ORDER — MORPHINE SULFATE (PF) 2 MG/ML IV SOLN: 2.0000 mg | INTRAVENOUS | Status: DC | PRN

## 2016-10-10 MED ORDER — LACTATED RINGERS IV SOLN
INTRAVENOUS | Status: DC | PRN
  Administered 2016-10-10: 07:00:00 via INTRAVENOUS

## 2016-10-10 MED ORDER — ESMOLOL HCL 100 MG/10ML IV SOLN
INTRAVENOUS | Status: AC
  Filled 2016-10-10: qty 10

## 2016-10-10 MED ORDER — MIDAZOLAM HCL 5 MG/5ML IJ SOLN
INTRAMUSCULAR | Status: DC | PRN
  Administered 2016-10-10: 1 mg via INTRAVENOUS
  Administered 2016-10-10: 2 mg via INTRAVENOUS
  Administered 2016-10-10: 1 mg via INTRAVENOUS
  Administered 2016-10-10 (×2): 2 mg via INTRAVENOUS
  Administered 2016-10-10: 1 mg via INTRAVENOUS

## 2016-10-10 MED ORDER — LIDOCAINE 2% (20 MG/ML) 5 ML SYRINGE
INTRAMUSCULAR | Status: DC | PRN
  Administered 2016-10-10: 100 mg via INTRAVENOUS
  Administered 2016-10-10: 50 mg via INTRAVENOUS

## 2016-10-10 MED ORDER — DOCUSATE SODIUM 100 MG PO CAPS
200.0000 mg | ORAL_CAPSULE | Freq: Every day | ORAL | Status: DC
  Administered 2016-10-11 – 2016-10-12 (×2): 200 mg via ORAL
  Filled 2016-10-10 (×2): qty 2

## 2016-10-10 MED ORDER — CALCIUM CHLORIDE 10 % IV SOLN
INTRAVENOUS | Status: AC
  Filled 2016-10-10: qty 10

## 2016-10-10 MED ORDER — DEXMEDETOMIDINE HCL IN NACL 200 MCG/50ML IV SOLN
0.0000 ug/kg/h | INTRAVENOUS | Status: DC
  Administered 2016-10-10: 0.5 ug/kg/h via INTRAVENOUS

## 2016-10-10 MED ORDER — NITROGLYCERIN IN D5W 200-5 MCG/ML-% IV SOLN: 7.0000 ug/min | INTRAVENOUS | Status: AC

## 2016-10-10 MED ORDER — SODIUM CHLORIDE 0.45 % IV SOLN
INTRAVENOUS | Status: DC | PRN
Start: 2016-10-10 — End: 2016-10-12
  Administered 2016-10-10: 16:00:00 via INTRAVENOUS

## 2016-10-10 MED ORDER — ACETAMINOPHEN 650 MG RE SUPP
650.0000 mg | Freq: Once | RECTAL | Status: AC
  Administered 2016-10-10: 650 mg via RECTAL

## 2016-10-10 MED ORDER — BISACODYL 5 MG PO TBEC
10.0000 mg | DELAYED_RELEASE_TABLET | Freq: Every day | ORAL | Status: DC
  Administered 2016-10-11 – 2016-10-12 (×2): 10 mg via ORAL
  Filled 2016-10-10 (×2): qty 2

## 2016-10-10 MED ORDER — PANTOPRAZOLE SODIUM 40 MG PO TBEC
40.0000 mg | DELAYED_RELEASE_TABLET | Freq: Every day | ORAL | Status: DC
  Administered 2016-10-12 – 2016-10-13 (×2): 40 mg via ORAL
  Filled 2016-10-10 (×2): qty 1

## 2016-10-10 MED ORDER — HEPARIN SODIUM (PORCINE) 1000 UNIT/ML IJ SOLN
INTRAMUSCULAR | Status: DC | PRN
  Administered 2016-10-10: 2000 [IU] via INTRAVENOUS
  Administered 2016-10-10: 28000 [IU] via INTRAVENOUS

## 2016-10-10 MED ORDER — CHLORHEXIDINE GLUCONATE 0.12 % MT SOLN
15.0000 mL | OROMUCOSAL | Status: AC
  Administered 2016-10-10: 15 mL via OROMUCOSAL
  Filled 2016-10-10: qty 15

## 2016-10-10 MED ORDER — FENTANYL CITRATE (PF) 250 MCG/5ML IJ SOLN
INTRAMUSCULAR | Status: DC | PRN
  Administered 2016-10-10 (×2): 100 ug via INTRAVENOUS
  Administered 2016-10-10: 50 ug via INTRAVENOUS
  Administered 2016-10-10: 250 ug via INTRAVENOUS
  Administered 2016-10-10: 150 ug via INTRAVENOUS
  Administered 2016-10-10: 500 ug via INTRAVENOUS
  Administered 2016-10-10: 50 ug via INTRAVENOUS

## 2016-10-10 MED ORDER — ROCURONIUM BROMIDE 100 MG/10ML IV SOLN
INTRAVENOUS | Status: DC | PRN
  Administered 2016-10-10 (×4): 50 mg via INTRAVENOUS

## 2016-10-10 MED ORDER — ACETAMINOPHEN 160 MG/5ML PO SOLN: 650.0000 mg | Freq: Once | ORAL | Status: AC

## 2016-10-10 MED ORDER — INSULIN REGULAR BOLUS VIA INFUSION
0.0000 [IU] | Freq: Three times a day (TID) | INTRAVENOUS | Status: DC
  Filled 2016-10-10: qty 10

## 2016-10-10 MED ORDER — SODIUM CHLORIDE 0.9% FLUSH: 3.0000 mL | INTRAVENOUS | Status: DC | PRN

## 2016-10-10 MED ORDER — TRAMADOL HCL 50 MG PO TABS
50.0000 mg | ORAL_TABLET | ORAL | Status: DC | PRN
Start: 2016-10-10 — End: 2016-10-13

## 2016-10-10 MED ORDER — LACTATED RINGERS IV SOLN
INTRAVENOUS | Status: DC | PRN
  Administered 2016-10-10 (×2): via INTRAVENOUS

## 2016-10-10 MED ORDER — PROPOFOL 10 MG/ML IV BOLUS
INTRAVENOUS | Status: DC | PRN
  Administered 2016-10-10: 50 mg via INTRAVENOUS

## 2016-10-10 MED ORDER — 0.9 % SODIUM CHLORIDE (POUR BTL) OPTIME
TOPICAL | Status: DC | PRN
  Administered 2016-10-10: 1000 mL

## 2016-10-10 MED ORDER — KETOROLAC TROMETHAMINE 30 MG/ML IJ SOLN
30.0000 mg | Freq: Four times a day (QID) | INTRAMUSCULAR | Status: AC
  Administered 2016-10-10 – 2016-10-11 (×4): 30 mg via INTRAVENOUS
  Filled 2016-10-10 (×4): qty 1

## 2016-10-10 MED ORDER — DEXMEDETOMIDINE HCL IN NACL 400 MCG/100ML IV SOLN
INTRAVENOUS | Status: DC | PRN
  Administered 2016-10-10: .3 ug/kg/h via INTRAVENOUS

## 2016-10-10 MED ORDER — SUCCINYLCHOLINE CHLORIDE 20 MG/ML IJ SOLN
INTRAMUSCULAR | Status: DC | PRN
  Administered 2016-10-10: 120 mg via INTRAVENOUS

## 2016-10-10 MED ORDER — POTASSIUM CHLORIDE 10 MEQ/50ML IV SOLN
10.0000 meq | INTRAVENOUS | Status: AC
  Administered 2016-10-10 (×3): 10 meq via INTRAVENOUS

## 2016-10-10 MED ORDER — MORPHINE SULFATE (PF) 2 MG/ML IV SOLN: 1.0000 mg | INTRAVENOUS | Status: AC | PRN

## 2016-10-10 MED ORDER — PROPOFOL 10 MG/ML IV BOLUS
INTRAVENOUS | Status: AC
  Filled 2016-10-10: qty 20

## 2016-10-10 MED ORDER — ACETAMINOPHEN 500 MG PO TABS
1000.0000 mg | ORAL_TABLET | Freq: Four times a day (QID) | ORAL | Status: DC
  Administered 2016-10-11 – 2016-10-13 (×8): 1000 mg via ORAL
  Filled 2016-10-10 (×8): qty 2

## 2016-10-10 MED ORDER — ASPIRIN 81 MG PO CHEW: 324.0000 mg | CHEWABLE_TABLET | Freq: Every day | ORAL | Status: DC

## 2016-10-10 MED ORDER — METOPROLOL TARTRATE 25 MG/10 ML ORAL SUSPENSION: 12.5000 mg | Freq: Two times a day (BID) | ORAL | Status: DC

## 2016-10-10 MED ORDER — ACETAMINOPHEN 160 MG/5ML PO SOLN: 1000.0000 mg | Freq: Four times a day (QID) | ORAL | Status: DC

## 2016-10-10 MED ORDER — FENTANYL CITRATE (PF) 250 MCG/5ML IJ SOLN
INTRAMUSCULAR | Status: AC
  Filled 2016-10-10: qty 25

## 2016-10-10 MED ORDER — HEPARIN SODIUM (PORCINE) 1000 UNIT/ML IJ SOLN
INTRAMUSCULAR | Status: AC
  Filled 2016-10-10: qty 1

## 2016-10-10 MED ORDER — OXYCODONE HCL 5 MG PO TABS
5.0000 mg | ORAL_TABLET | ORAL | Status: DC | PRN
  Administered 2016-10-11 (×4): 10 mg via ORAL
  Filled 2016-10-10 (×4): qty 2

## 2016-10-10 MED ORDER — INSULIN REGULAR HUMAN 100 UNIT/ML IJ SOLN
INTRAMUSCULAR | Status: DC
  Administered 2016-10-10: 1.2 [IU]/h via INTRAVENOUS
  Filled 2016-10-10: qty 2.5

## 2016-10-10 MED FILL — Magnesium Sulfate Inj 50%: INTRAMUSCULAR | Qty: 10 | Status: AC

## 2016-10-10 MED FILL — Heparin Sodium (Porcine) Inj 1000 Unit/ML: INTRAMUSCULAR | Qty: 30 | Status: AC

## 2016-10-10 MED FILL — Potassium Chloride Inj 2 mEq/ML: INTRAVENOUS | Qty: 40 | Status: AC

## 2016-10-10 SURGICAL SUPPLY — 102 items
BAG DECANTER FOR FLEXI CONT (MISCELLANEOUS) ×4 IMPLANT
BANDAGE ACE 4X5 VEL STRL LF (GAUZE/BANDAGES/DRESSINGS) ×8 IMPLANT
BANDAGE ACE 6X5 VEL STRL LF (GAUZE/BANDAGES/DRESSINGS) ×4 IMPLANT
BASKET HEART  (ORDER IN 25'S) (MISCELLANEOUS) ×1
BASKET HEART (ORDER IN 25'S) (MISCELLANEOUS) ×1
BASKET HEART (ORDER IN 25S) (MISCELLANEOUS) ×2 IMPLANT
BLADE STERNUM SYSTEM 6 (BLADE) ×4 IMPLANT
BLADE SURG 11 STRL SS (BLADE) ×4 IMPLANT
BLADE SURG 15 STRL LF DISP TIS (BLADE) ×2 IMPLANT
BLADE SURG 15 STRL SS (BLADE) ×2
BNDG GAUZE ELAST 4 BULKY (GAUZE/BANDAGES/DRESSINGS) ×8 IMPLANT
CANISTER SUCTION 2500CC (MISCELLANEOUS) ×4 IMPLANT
CANNULA EZ GLIDE AORTIC 21FR (CANNULA) ×4 IMPLANT
CATH CPB KIT HENDRICKSON (MISCELLANEOUS) ×4 IMPLANT
CATH ROBINSON RED A/P 18FR (CATHETERS) ×4 IMPLANT
CATH THORACIC 36FR (CATHETERS) ×4 IMPLANT
CATH THORACIC 36FR RT ANG (CATHETERS) ×4 IMPLANT
CLIP FOGARTY SPRING 6M (CLIP) ×4 IMPLANT
CLIP TI MEDIUM 24 (CLIP) IMPLANT
CLIP TI WIDE RED SMALL 24 (CLIP) ×8 IMPLANT
CRADLE DONUT ADULT HEAD (MISCELLANEOUS) ×4 IMPLANT
DRAPE CARDIOVASCULAR INCISE (DRAPES) ×2
DRAPE SLUSH/WARMER DISC (DRAPES) ×4 IMPLANT
DRAPE SRG 135X102X78XABS (DRAPES) ×2 IMPLANT
DRSG COVADERM 4X14 (GAUZE/BANDAGES/DRESSINGS) ×4 IMPLANT
ELECT REM PT RETURN 9FT ADLT (ELECTROSURGICAL) ×8
ELECTRODE REM PT RTRN 9FT ADLT (ELECTROSURGICAL) ×4 IMPLANT
FELT TEFLON 1X6 (MISCELLANEOUS) ×8 IMPLANT
GAUZE SPONGE 4X4 12PLY STRL (GAUZE/BANDAGES/DRESSINGS) ×8 IMPLANT
GLOVE SURG SIGNA 7.5 PF LTX (GLOVE) ×12 IMPLANT
GOWN STRL REUS W/ TWL LRG LVL3 (GOWN DISPOSABLE) ×8 IMPLANT
GOWN STRL REUS W/ TWL XL LVL3 (GOWN DISPOSABLE) ×4 IMPLANT
GOWN STRL REUS W/TWL LRG LVL3 (GOWN DISPOSABLE) ×8
GOWN STRL REUS W/TWL XL LVL3 (GOWN DISPOSABLE) ×4
HEMOSTAT POWDER SURGIFOAM 1G (HEMOSTASIS) ×12 IMPLANT
HEMOSTAT SURGICEL 2X14 (HEMOSTASIS) ×4 IMPLANT
INSERT FOGARTY XLG (MISCELLANEOUS) IMPLANT
IV CATH 18G X1.75 CATHLON (IV SOLUTION) ×4 IMPLANT
KIT BASIN OR (CUSTOM PROCEDURE TRAY) ×4 IMPLANT
KIT ROOM TURNOVER OR (KITS) ×4 IMPLANT
KIT SUCTION CATH 14FR (SUCTIONS) ×8 IMPLANT
KIT VASOVIEW HEMOPRO VH 3000 (KITS) ×4 IMPLANT
MARKER GRAFT CORONARY BYPASS (MISCELLANEOUS) ×12 IMPLANT
NS IRRIG 1000ML POUR BTL (IV SOLUTION) ×20 IMPLANT
PACK OPEN HEART (CUSTOM PROCEDURE TRAY) ×4 IMPLANT
PAD ARMBOARD 7.5X6 YLW CONV (MISCELLANEOUS) ×8 IMPLANT
PAD ELECT DEFIB RADIOL ZOLL (MISCELLANEOUS) ×4 IMPLANT
PENCIL BUTTON HOLSTER BLD 10FT (ELECTRODE) ×4 IMPLANT
PUNCH AORTIC ROTATE  4.5MM 8IN (MISCELLANEOUS) ×4 IMPLANT
PUNCH AORTIC ROTATE 4.0MM (MISCELLANEOUS) IMPLANT
PUNCH AORTIC ROTATE 4.5MM 8IN (MISCELLANEOUS) IMPLANT
PUNCH AORTIC ROTATE 5MM 8IN (MISCELLANEOUS) IMPLANT
SET CARDIOPLEGIA MPS 5001102 (MISCELLANEOUS) ×4 IMPLANT
SHEARS HARMONIC 9CM CVD (BLADE) ×4 IMPLANT
SPONGE GAUZE 4X4 12PLY STER LF (GAUZE/BANDAGES/DRESSINGS) ×12 IMPLANT
SPONGE LAP 4X18 X RAY DECT (DISPOSABLE) ×4 IMPLANT
STOPCOCK 4 WAY LG BORE MALE ST (IV SETS) ×4 IMPLANT
SUT BONE WAX W31G (SUTURE) ×4 IMPLANT
SUT ETHIBOND 2 0 SH (SUTURE) ×8
SUT ETHIBOND 2 0 SH 36X2 (SUTURE) ×8 IMPLANT
SUT MNCRL AB 4-0 PS2 18 (SUTURE) ×4 IMPLANT
SUT PROLENE 3 0 SH DA (SUTURE) ×4 IMPLANT
SUT PROLENE 4 0 RB 1 (SUTURE) ×4
SUT PROLENE 4 0 SH DA (SUTURE) IMPLANT
SUT PROLENE 4-0 RB1 .5 CRCL 36 (SUTURE) ×4 IMPLANT
SUT PROLENE 5 0 C 1 36 (SUTURE) ×8 IMPLANT
SUT PROLENE 6 0 C 1 30 (SUTURE) ×16 IMPLANT
SUT PROLENE 7 0 BV1 MDA (SUTURE) ×8 IMPLANT
SUT PROLENE 8 0 BV175 6 (SUTURE) ×12 IMPLANT
SUT SILK  1 MH (SUTURE) ×6
SUT SILK 1 MH (SUTURE) ×6 IMPLANT
SUT SILK 1 TIES 10X30 (SUTURE) ×8 IMPLANT
SUT SILK 2 0 SH CR/8 (SUTURE) ×16 IMPLANT
SUT SILK 2 0 TIES 17X18 (SUTURE) ×2
SUT SILK 2-0 18XBRD TIE BLK (SUTURE) ×2 IMPLANT
SUT SILK 4 0 TIE 10X30 (SUTURE) ×4 IMPLANT
SUT STEEL 6MS V (SUTURE) ×4 IMPLANT
SUT STEEL STERNAL CCS#1 18IN (SUTURE) IMPLANT
SUT STEEL SZ 6 DBL 3X14 BALL (SUTURE) ×4 IMPLANT
SUT TEM PAC WIRE 2 0 SH (SUTURE) ×16 IMPLANT
SUT VIC AB 1 CTX 36 (SUTURE) ×4
SUT VIC AB 1 CTX36XBRD ANBCTR (SUTURE) ×4 IMPLANT
SUT VIC AB 2-0 CT1 27 (SUTURE) ×6
SUT VIC AB 2-0 CT1 TAPERPNT 27 (SUTURE) ×6 IMPLANT
SUT VIC AB 2-0 CTX 27 (SUTURE) ×8 IMPLANT
SUT VIC AB 3-0 SH 27 (SUTURE)
SUT VIC AB 3-0 SH 27X BRD (SUTURE) IMPLANT
SUT VIC AB 3-0 X1 27 (SUTURE) ×12 IMPLANT
SUT VICRYL 4-0 PS2 18IN ABS (SUTURE) IMPLANT
SUTURE E-PAK OPEN HEART (SUTURE) ×4 IMPLANT
SYR CONTROL 10ML LL (SYRINGE) ×4 IMPLANT
SYSTEM SAHARA CHEST DRAIN ATS (WOUND CARE) ×4 IMPLANT
TAPE CLOTH SURG 4X10 WHT LF (GAUZE/BANDAGES/DRESSINGS) ×8 IMPLANT
TOWEL OR 17X24 6PK STRL BLUE (TOWEL DISPOSABLE) ×8 IMPLANT
TOWEL OR 17X26 10 PK STRL BLUE (TOWEL DISPOSABLE) ×8 IMPLANT
TRAY CATH LUMEN 1 20CM STRL (SET/KITS/TRAYS/PACK) ×4 IMPLANT
TRAY FOLEY IC TEMP SENS 16FR (CATHETERS) ×4 IMPLANT
TUBE FEEDING 8FR 16IN STR KANG (MISCELLANEOUS) ×4 IMPLANT
TUBING ART PRESS 48 MALE/FEM (TUBING) ×8 IMPLANT
TUBING INSUFFLATION (TUBING) ×4 IMPLANT
UNDERPAD 30X30 (UNDERPADS AND DIAPERS) ×4 IMPLANT
WATER STERILE IRR 1000ML POUR (IV SOLUTION) ×8 IMPLANT

## 2016-10-10 NOTE — H&P (View-Only) (Signed)
Reason for Consult:3 vessel CAD Referring Physician: Dr. Nolon Bussing is an 60 y.o. male.  HPI: 60 yo man with a history of hyperlipidemia, nephrolithiasis, heart murmur and prostate cancer and a strong family history(maternal) of CAD. He presents with a cc/o CP. The first episode was last summer while playing golf. Described as a dull aching pain in mid chest. Lasted 20 minutes, but was able to finish his round. He has had 4 additional episodes since then. Most recent was while playing paddle tennis. Lasted about 5 minutes and resolved with rest. He saw Dr. Harrington Challenger. A stress test was stopped early after 6 minutes due to ST changes and ventricular ectopy. He did not have pain.  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  Past Medical History:  Diagnosis Date  . Coronary artery disease   . Heart murmur    "I've had it my whole life" (10/08/2016)  . High cholesterol   . History of kidney stones    "passed it" (10/08/2016)  . Prostate cancer ([place]) 2005    Past Surgical History:  Procedure Laterality Date  . CARDIAC CATHETERIZATION  10/08/2016  . CARDIAC CATHETERIZATION N/A 10/08/2016   Procedure: Left Heart Cath and Coronary Angiography;  Surgeon: Belva Crome, MD;  Location: [facility];  Service: Cardiovascular;  Laterality: N/A;  . COLONOSCOPY WITH PROPOFOL N/A 01/29/2016   Procedure: COLONOSCOPY WITH PROPOFOL;  Surgeon: Garlan Fair, MD;  Location: [facility];  Service: Endoscopy;  Laterality: N/A;  . INGUINAL HERNIA REPAIR Bilateral 1958   "before I was 41 months old ="  . PROSTATE BIOPSY  2005  . PROSTATECTOMY  2005    Family History  Problem Relation Age of Onset  . CAD Mother   . Obesity Brother     Social History:  reports that he quit smoking about 18 years ago. His smoking use included Cigarettes. He has a 20.00 pack-year smoking history. He has never used smokeless tobacco. He reports that he drinks  alcohol. He reports that he uses drugs.  Allergies: No Known Allergies  Medications:  Prior to Admission:  Prescriptions Prior to Admission  Medication Sig Dispense Refill Last Dose  . aspirin 81 MG chewable tablet Chew 81 mg by mouth daily.   10/07/2016 at Unknown time  . nitroGLYCERIN (NITROSTAT) 0.4 MG SL tablet Place 1 tablet (0.4 mg total) under the tongue every 5 (five) minutes as needed. 25 tablet 3 Unknown at Unknown    Results for orders placed or performed during the hospital encounter of 10/08/16 (from the past 48 hour(s))  Basic metabolic panel     Status: None   Collection Time: 10/08/16 12:28 PM  Result Value Ref Range   Sodium 136 135 - 145 mmol/L   Potassium 4.1 3.5 - 5.1 mmol/L   Chloride 103 101 - 111 mmol/L   CO2 25 22 - 32 mmol/L   Glucose, Bld 94 65 - 99 mg/dL   BUN 13 6 - 20 mg/dL   Creatinine, Ser 0.88 0.61 - 1.24 mg/dL   Calcium 9.1 8.9 - 10.3 mg/dL   GFR calc non Af Amer >60 >60 mL/min   GFR calc Af Amer >60 >60 mL/min    Comment: (NOTE) The eGFR has been calculated using the CKD EPI equation. This calculation has not been validated in all clinical situations. eGFR's persistently <60 mL/min signify possible Chronic Kidney Disease.    Anion gap 8 5 -  15  Protime-INR     Status: None   Collection Time: 10/08/16 12:28 PM  Result Value Ref Range   Prothrombin Time 12.5 11.4 - 15.2 seconds   INR 0.93   MRSA PCR Screening     Status: None   Collection Time: 10/08/16 10:24 PM  Result Value Ref Range   MRSA by PCR NEGATIVE NEGATIVE    Comment:        The GeneXpert MRSA Assay (FDA approved for NASAL specimens only), is one component of a comprehensive MRSA colonization surveillance program. It is not intended to diagnose MRSA infection nor to guide or monitor treatment for MRSA infections.   Basic metabolic panel     Status: Abnormal   Collection Time: 10/09/16  2:58 AM  Result Value Ref Range   Sodium 136 135 - 145 mmol/L   Potassium 3.7 3.5 -  5.1 mmol/L   Chloride 103 101 - 111 mmol/L   CO2 24 22 - 32 mmol/L   Glucose, Bld 101 (H) 65 - 99 mg/dL   BUN 13 6 - 20 mg/dL   Creatinine, Ser 0.83 0.61 - 1.24 mg/dL   Calcium 8.7 (L) 8.9 - 10.3 mg/dL   GFR calc non Af Amer >60 >60 mL/min   GFR calc Af Amer >60 >60 mL/min    Comment: (NOTE) The eGFR has been calculated using the CKD EPI equation. This calculation has not been validated in all clinical situations. eGFR's persistently <60 mL/min signify possible Chronic Kidney Disease.    Anion gap 9 5 - 15  CBC     Status: None   Collection Time: 10/09/16  2:58 AM  Result Value Ref Range   WBC 9.2 4.0 - 10.5 K/uL   RBC 5.31 4.22 - 5.81 MIL/uL   Hemoglobin 15.7 13.0 - 17.0 g/dL   HCT 45.3 39.0 - 52.0 %   MCV 85.3 78.0 - 100.0 fL   MCH 29.6 26.0 - 34.0 pg   MCHC 34.7 30.0 - 36.0 g/dL   RDW 13.0 11.5 - 15.5 %   Platelets 177 150 - 400 K/uL    No results found.  Review of Systems  Constitutional: Negative for chills, fever and malaise/fatigue.  Eyes: Negative for blurred vision and double vision.  Respiratory: Negative for cough, shortness of breath and wheezing.   Cardiovascular: Positive for chest pain. Negative for palpitations, orthopnea, claudication, leg swelling and PND.  Gastrointestinal: Negative for abdominal pain, nausea and vomiting.  Genitourinary: Negative for dysuria.       Prostatectomy  Musculoskeletal: Negative for back pain and myalgias.  Neurological: Negative for focal weakness, seizures, loss of consciousness and weakness.  Endo/Heme/Allergies: Does not bruise/bleed easily.  All other systems reviewed and are negative.  Blood pressure 126/75, pulse (!) 59, temperature 98 F (36.7 C), temperature source Oral, resp. rate 17, height _0  (1.803 m), weight 205 lb 0.4 oz (93 kg), SpO2 96 %. Physical Exam  Vitals reviewed. Constitutional: He is oriented to person, place, and time. He appears well-developed and well-nourished. No distress.  HENT:  Head:  Normocephalic and atraumatic.  Mouth/Throat: No oropharyngeal exudate.  Eyes: Conjunctivae and EOM are normal. Pupils are equal, round, and reactive to light. No scleral icterus.  Neck: Neck supple. No thyromegaly present.  Cardiovascular: Normal rate, regular rhythm and intact distal pulses.   Murmur (2/6 systolic) heard. Normal Allen's test on left  Respiratory: Effort normal and breath sounds normal. No respiratory distress. He has no wheezes. He has no rales.  GI: Soft. Bowel sounds are normal. He exhibits no distension. There is no tenderness.  Musculoskeletal: Normal range of motion. He exhibits no edema.  Lymphadenopathy:    He has no cervical adenopathy.  Neurological: He is alert and oriented to person, place, and time. No cranial nerve deficit.  No motor deficit  Skin: Skin is warm and dry.  Psychiatric: He has a normal mood and affect.   Cardiac Catheterization Conclusion     LV end diastolic pressure is normal.  The left ventricular systolic function is normal.  The left ventricular ejection fraction is 55-65% by visual estimate.  Prox RCA lesion, 90 %stenosed.  Mid RCA lesion, 80 %stenosed.  1st Mrg lesion, 90 %stenosed.  Mid Cx lesion, 85 %stenosed.  Prox Cx to Mid Cx lesion, 40 %stenosed.  Ost 1st Diag lesion, 90 %stenosed.  Prox LAD lesion, 95 %stenosed.  Mid LAD lesion, 50 %stenosed.  Ost 2nd Diag lesion, 80 %stenosed.    Severe three-vessel coronary disease involving proximal left anterior descending and diagonal bifurcation stenosis, [place] 1, 1, 1. Moderate mid LAD stenosis at bifurcation with the second diagonal which contains 60-70% stenosis.  90% obstruction in the bifurcating first obtuse marginal. Eccentric 70-80% stenosis in the mid circumflex beyond the second obtuse marginal seen best in the LAO and LAO caudal views.  Tandem 90% stenoses in the nondominant right coronary  Normal left ventricular size and  function.  RECOMMENDATIONS:   Given severity and complexity of disease, coronary bypass grafting is the recommended therapy for symptom control and enhanced longevity.  Given the early positive exercise treadmill test and absence of symptoms (silent ischemia) associated with high-grade ventricular ectopy, I believe it is best that the patient remain hospitalized.  Beta blocker therapy.  Therapeutic anticoagulation for ACS    I personally reviewed the cath films and concur with the findings noted above  Assessment/Plan: 60 yo man with a strong family history of CAD and hyperlipidemia presents with exertional angina. At cath has severe 3 vessel CAD with preserved LV function. CABG indicated for survival benefit and relief of symptoms.  I discussed with the patient the general nature of the procedure, the need for general anesthesia, the use of cardiopulmonary bypass, and the incisions to be used. We discussed the use of the left radial artery. We discussed the expected hospital stay, overall recovery and short and long term outcomes. I informed him of the indications, risks, benefits and alternatives. He understands the risks include, but are not limited to death, stroke, MI, DVT/PE, bleeding, possible need for transfusion, infections, cardiac arrhythmias, as well as other organ system dysfunction including respiratory, renal, or GI complications.   He accepts the risks and agrees to proceed  For CABG with left radial artery in AM Melrose Nakayama 10/09/2016, 6:18 PM

## 2016-10-10 NOTE — Interval H&P Note (Signed)
History and Physical Interval Note:  10/10/2016 7:43 AM  Joseph Jimenez  has presented today for surgery, with the diagnosis of CAD  The various methods of treatment have been discussed with the patient and family. After consideration of risks, benefits and other options for treatment, the patient has consented to  Procedure(s): CORONARY ARTERY BYPASS GRAFTING (CABG) (N/A) TRANSESOPHAGEAL ECHOCARDIOGRAM (TEE) (N/A) as a surgical intervention .  The patient's history has been reviewed, patient examined, no change in status, stable for surgery.  I have reviewed the patient's chart and labs.  Questions were answered to the patient's satisfaction.     Melrose Nakayama

## 2016-10-10 NOTE — Progress Notes (Signed)
Patient ID: Joseph Jimenez, male   DOB: June 14, 1956, 60 y.o.   MRN: [id number]  SICU Evening Rounds:   Hemodynamically stable  CI = 1.9  Extubated and alert  Urine output good  CT output low  CBC    Component Value Date/Time   WBC 18.9 (H) 10/10/2016 1540   RBC 4.40 10/10/2016 1540   HGB 11.9 (L) 10/10/2016 1543   HCT 35.0 (L) 10/10/2016 1543   PLT 124 (L) 10/10/2016 1540   MCV 84.3 10/10/2016 1540   MCH 29.3 10/10/2016 1540   MCHC 34.8 10/10/2016 1540   RDW 12.8 10/10/2016 1540   LYMPHSABS 2,340 10/07/2016 0816   MONOABS 468 10/07/2016 0816   EOSABS 234 10/07/2016 0816   BASOSABS 78 10/07/2016 0816     BMET    Component Value Date/Time   NA 141 10/10/2016 1543   K 3.9 10/10/2016 1543   CL 102 10/10/2016 1430   CO2 32 10/10/2016 0150   GLUCOSE 120 (H) 10/10/2016 1543   BUN 12 10/10/2016 1430   CREATININE 0.70 10/10/2016 1430   CALCIUM 9.1 10/10/2016 0150   GFRNONAA >60 10/10/2016 0150   GFRAA >60 10/10/2016 0150     A/P:  Stable postop course. Continue current plans

## 2016-10-10 NOTE — Transfer of Care (Signed)
Immediate Anesthesia Transfer of Care Note  Patient: Joseph Jimenez  Procedure(s) Performed: Procedure(s) with comments: CORONARY ARTERY BYPASS GRAFTING (CABG) (N/A) - Time 5  using left internal mammary artery, endoscopically harvested right saphenous vein, and left radial artery. TRANSESOPHAGEAL ECHOCARDIOGRAM (TEE) (N/A) CYSTOSCOPY (N/A)  Patient Location: SICU  Anesthesia Type:General  Level of Consciousness: sedated and Patient remains intubated per anesthesia plan  Airway & Oxygen Therapy: Patient remains intubated per anesthesia plan  Post-op Assessment: Report given to RN and Post -op Vital signs reviewed and stable  Post vital signs: Reviewed and stable  Last Vitals:  Vitals:   10/10/16 0500 10/10/16 0519  BP:  122/71  Pulse:  65  Resp: 18 17  Temp:  36.4 C    Last Pain:  Vitals:   10/10/16 0519  TempSrc: Oral         Complications: No apparent anesthesia complications

## 2016-10-10 NOTE — Progress Notes (Signed)
  Echocardiogram Echocardiogram Transesophageal has been performed.  Bobbye Charleston 10/10/2016, 9:02 AM

## 2016-10-10 NOTE — Anesthesia Procedure Notes (Signed)
Procedure Name: Intubation Date/Time: 10/10/2016 8:12 AM Performed by: Greggory Stallion, Merwyn Hodapp L Pre-anesthesia Checklist: Patient identified, Emergency Drugs available, Suction available and Patient being monitored Patient Re-evaluated:Patient Re-evaluated prior to inductionOxygen Delivery Method: Circle System Utilized Preoxygenation: Pre-oxygenation with 100% oxygen Intubation Type: IV induction Ventilation: Mask ventilation with difficulty, Oral airway inserted - appropriate to patient size and Two handed mask ventilation required Laryngoscope Size: Mac and 3 Grade View: Grade I Tube type: Oral Tube size: 8.0 mm Number of attempts: 1 Airway Equipment and Method: Stylet and Oral airway Placement Confirmation: ETT inserted through vocal cords under direct vision,  positive ETCO2 and breath sounds checked- equal and bilateral Secured at: 23 cm Tube secured with: Tape Dental Injury: Teeth and Oropharynx as per pre-operative assessment

## 2016-10-10 NOTE — Consult Note (Addendum)
Consult: difficult foley Requested by: Dr. Melrose Nakayama  History of Present Illness:  60 year old undergoing coronary artery bypass grafting and Foley was not able to be placed. Per nursing seemed to be a distal obstruction. I discussed the patient with his wife and she reports he has had no recent voiding complaints. He had a radical retropubic prostatectomy done in 2005 for Gleason 6 prostate cancer.  I reviewed his Epic chart as well as [facility] chart.  Past Medical History:  Diagnosis Date  . Coronary artery disease   . Heart murmur    "I've had it my whole life" (10/08/2016)  . High cholesterol   . History of kidney stones    "passed it" (10/08/2016)  . Prostate cancer ([place]) 2005   Past Surgical History:  Procedure Laterality Date  . CARDIAC CATHETERIZATION  10/08/2016  . CARDIAC CATHETERIZATION N/A 10/08/2016   Procedure: Left Heart Cath and Coronary Angiography;  Surgeon: Belva Crome, MD;  Location: [place];  Service: Cardiovascular;  Laterality: N/A;  . COLONOSCOPY WITH PROPOFOL N/A 01/29/2016   Procedure: COLONOSCOPY WITH PROPOFOL;  Surgeon: Garlan Fair, MD;  Location: [facility];  Service: Endoscopy;  Laterality: N/A;  . INGUINAL HERNIA REPAIR Bilateral 1958   "before I was 66 months old ="  . PROSTATE BIOPSY  2005  . PROSTATECTOMY  2005    Home Medications:  Prescriptions Prior to Admission  Medication Sig Dispense Refill Last Dose  . aspirin 81 MG chewable tablet Chew 81 mg by mouth daily.   10/07/2016 at Unknown time  . nitroGLYCERIN (NITROSTAT) 0.4 MG SL tablet Place 1 tablet (0.4 mg total) under the tongue every 5 (five) minutes as needed. 25 tablet 3 Unknown at Unknown   Allergies: No Known Allergies  Family History  Problem Relation Age of Onset  . CAD Mother   . Obesity Brother    Social History:  reports that he quit smoking about 18 years ago. His smoking use included Cigarettes. He has a 20.00 pack-year smoking history.  He has never used smokeless tobacco. He reports that he drinks alcohol. He reports that he uses drugs.  ROS: A complete review of systems was performed.  All systems are negative except for pertinent findings as noted. Review of Systems  Unable to perform ROS: Intubated     Physical Exam:  Vital signs in last 24 hours: Temp:  [97.6 F (36.4 C)-98.3 F (36.8 C)] 97.6 F (36.4 C) (11/16 0519) Pulse Rate:  [45-66] 65 (11/16 0519) Resp:  [16-22] 17 (11/16 0519) BP: (122-180)/(71-87) 122/71 (11/16 0519) SpO2:  [96 %] 96 % (11/16 0519) Weight:  [93.2 kg (205 lb 7.5 oz)] 93.2 kg (205 lb 7.5 oz) (11/16 0500) Patient intubated and asleep and operating room. Abdomen: Midline scar from prostatectomy GU: Penis circumcised without mass or lesion. Meatus appears normal.  Laboratory Data:  Results for orders placed or performed during the hospital encounter of 10/08/16 (from the past 24 hour(s))  Type and screen [place]     Status: None   Collection Time: 10/09/16  7:12 PM  Result Value Ref Range   ABO/RH(D) B POS    Antibody Screen NEG    Sample Expiration 10/12/2016   ABO/Rh     Status: None   Collection Time: 10/09/16  7:12 PM  Result Value Ref Range   ABO/RH(D) B POS   CBC     Status: None   Collection Time: 10/10/16  1:50 AM  Result Value Ref Range   WBC 9.2 4.0 - 10.5 K/uL   RBC 5.29 4.22 - 5.81 MIL/uL   Hemoglobin 15.7 13.0 - 17.0 g/dL   HCT 45.1 39.0 - 52.0 %   MCV 85.3 78.0 - 100.0 fL   MCH 29.7 26.0 - 34.0 pg   MCHC 34.8 30.0 - 36.0 g/dL   RDW 13.1 11.5 - 15.5 %   Platelets 192 150 - 400 K/uL  Basic metabolic panel     Status: Abnormal   Collection Time: 10/10/16  1:50 AM  Result Value Ref Range   Sodium 139 135 - 145 mmol/L   Potassium 4.1 3.5 - 5.1 mmol/L   Chloride 103 101 - 111 mmol/L   CO2 32 22 - 32 mmol/L   Glucose, Bld 120 (H) 65 - 99 mg/dL   BUN 13 6 - 20 mg/dL   Creatinine, Ser 1.01 0.61 - 1.24 mg/dL   Calcium 9.1 8.9 - 10.3 mg/dL    GFR calc non Af Amer >60 >60 mL/min   GFR calc Af Amer >60 >60 mL/min   Anion gap 4 (L) 5 - 15  APTT     Status: None   Collection Time: 10/10/16  1:50 AM  Result Value Ref Range   aPTT 36 24 - 36 seconds  Urinalysis, Routine w reflex microscopic (not at [facility])     Status: None   Collection Time: 10/10/16  3:01 AM  Result Value Ref Range   Color, Urine YELLOW YELLOW   APPearance CLEAR CLEAR   Specific Gravity, Urine 1.014 1.005 - 1.030   pH 5.5 5.0 - 8.0   Glucose, UA NEGATIVE NEGATIVE mg/dL   Hgb urine dipstick NEGATIVE NEGATIVE   Bilirubin Urine NEGATIVE NEGATIVE   Ketones, ur NEGATIVE NEGATIVE mg/dL   Protein, ur NEGATIVE NEGATIVE mg/dL   Nitrite NEGATIVE NEGATIVE   Leukocytes, UA NEGATIVE NEGATIVE  Blood gas, arterial     Status: Abnormal (Preliminary result)   Collection Time: 10/10/16  3:11 AM  Result Value Ref Range   FIO2 0.21    O2 Content PENDING L/min   pH, Arterial 7.421 7.350 - 7.450   pCO2 arterial 42.0 32.0 - 48.0 mmHg   pO2, Arterial 83.7 83.0 - 108.0 mmHg   Bicarbonate 26.8 20.0 - 28.0 mmol/L   Acid-Base Excess 2.7 (H) 0.0 - 2.0 mmol/L   O2 Saturation 96.2 %   Patient temperature 98.6    Collection site RIGHT RADIAL    Drawn by [id number]    Sample type ARTERIAL DRAW    Allens test (pass/fail) PASS PASS   Mechanical Rate ARTERIAL    Recent Results (from the past 240 hour(s))  MRSA PCR Screening     Status: None   Collection Time: 10/08/16 10:24 PM  Result Value Ref Range Status   MRSA by PCR NEGATIVE NEGATIVE Final    Comment:        The GeneXpert MRSA Assay (FDA approved for NASAL specimens only), is one component of a comprehensive MRSA colonization surveillance program. It is not intended to diagnose MRSA infection nor to guide or monitor treatment for MRSA infections.    Creatinine:  Recent Labs  10/08/16 1228 10/09/16 0258 10/10/16 0150  CREATININE 0.88 0.83 1.01    Impression/Assessment:  Difficult Foley likely urethral  stricture  Plan: I discussed the patient with his wife and we discussed the nature risks benefits and alternatives to cystoscopy with urethral stricture dilation. We discussed risks of postoperative incontinence among others. All  questions answered and she elected to proceed.  Joseph Jimenez 10/10/2016, 9:25 AM

## 2016-10-10 NOTE — Anesthesia Procedure Notes (Addendum)
Central Venous Catheter Insertion Performed by: anesthesiologist 10/10/2016 7:10 AM Preanesthetic checklist: patient identified, site marked, risks and benefits discussed, monitors and equipment checked, pre-op evaluation and timeout performed Position: Trendelenburg Lidocaine 1% used for infiltration Catheter size: 8.5 Fr PA cath was placed.Attempts: 1 Following insertion, line sutured, dressing applied and Biopatch. Post procedure assessment: blood return through all ports, free fluid flow and no air. Patient tolerated the procedure well with no immediate complications.

## 2016-10-10 NOTE — Progress Notes (Signed)
ABG collected, Right Radial on Room Air 21%.

## 2016-10-10 NOTE — Procedures (Signed)
Extubation Procedure Note  Patient Details:   Name: Joseph Jimenez DOB: [birth year] MRN: [id number]   Airway Documentation: Pt had good parameters of NIF -30 cmh20 and VC 1.1 L/min.  Extubated to 4 lpm Norco sat 97% able to speak name, have conversation with Dr. Roxan Hockey.  Tolerated well. Will continue to monitor pt.    Evaluation  O2 sats: stable throughout Complications: No apparent complications Patient did tolerate procedure well. Bilateral Breath Sounds: Clear   Yes  Ned Grace 10/10/2016, 6:30 PM

## 2016-10-10 NOTE — Anesthesia Procedure Notes (Addendum)
Central Venous Catheter Insertion Performed by: anesthesiologist 10/10/2016 7:05 AM Patient location: Pre-op. Preanesthetic checklist: patient identified, IV checked, site marked, risks and benefits discussed, surgical consent, monitors and equipment checked, pre-op evaluation and timeout performed Position: Trendelenburg Central line was placed.Double lumen Attempts: 1 Following insertion, line sutured, dressing applied and Biopatch. Post procedure assessment: blood return through all ports, free fluid flow and no air.

## 2016-10-10 NOTE — Brief Op Note (Signed)
10/08/2016 - 10/10/2016  1:47 PM  PATIENT:  Joseph Jimenez  60 y.o. male  PRE-OPERATIVE DIAGNOSIS:  CAD  POST-OPERATIVE DIAGNOSIS:  CAD, URETHRAL STRICTURE  PROCEDURE:  Procedure(s) with comments: CORONARY ARTERY BYPASS GRAFTING (CABG) (N/A) - Time 5  using left internal mammary artery, endoscopically harvested right saphenous vein, and left radial artery. TRANSESOPHAGEAL ECHOCARDIOGRAM (TEE) (N/A) CYSTOSCOPY (N/A)  LIMA-LAD L RADIAL -OM SVG-DIAG SVG-AM-LPD  SURGEON:  Surgeon(s) and Role:    * Melrose Nakayama, MD - Primary    * Festus Aloe, MD  PHYSICIAN ASSISTANT: Laquanna Veazey PA-C, TESSA CONTE PA-C  ANESTHESIA:   general  EBL:  Total I/O In: 1000 [I.V.:1000] Out: [id number] [Urine:1475]  BLOOD ADMINISTERED:none  DRAINS: USUAL   LOCAL MEDICATIONS USED:  NONE  SPECIMEN:  No Specimen  DISPOSITION OF SPECIMEN:  N/A  COUNTS:  YES  TOURNIQUET:  * No tourniquets in log *  DICTATION: .Other Dictation: Dictation Number [id number]  PLAN OF CARE: Admit to inpatient   PATIENT DISPOSITION:  ICU - intubated and hemodynamically stable.   Delay start of Pharmacological VTE agent (>24hrs) due to surgical blood loss or risk of bleeding: yes

## 2016-10-10 NOTE — Progress Notes (Signed)
Preprocedure diagnosis: difficult Foley Postprocedure diagnosis: Distal urethral stricture  Procedure: Dilation of urethral stricture, cystoscopy, Foley catheter placement over wire  Surgeon: Junious Silk  Anesthesia: Gen.  Description of procedure: After verbal consent was obtained patient was prepped and draped in the usual sterile fashion. A flexible cystoscope was passed per urethra and at the proximal fossa navicularis where it met the penile urethra there was a dense stricture. I then passed the cheater through the stricture into the distal penile urethra and passed a sensor wire without difficulty. I then dilated the stricture from 12-20 [place] without difficulty. It was quite dense. I then repassed the cystoscope over the wire and the remainder of the penile bulb and proximal urethra were normal without stricture. There was no bladder neck contracture. The bladder contained no stone or foreign body nor did I see any bladder tumor but flexion of the cystoscope was limited as the wire was through it. The cystoscope was backed out maintaining the wire. Over-the-wire an 33 [place] council tip catheter was advanced without difficulty. The balloon was inflated and pulled back to the bladder neck. It was left gravity drainage. Urine was clear.  Disposition: Pt returned to Dr. Leonarda Salon care for his procedure. Continue Foley catheter until patient ambulatory and at least 48 hours. Foley can be removed and he can follow-up with urology in the office in a few weeks.

## 2016-10-10 NOTE — Anesthesia Postprocedure Evaluation (Signed)
Anesthesia Post Note  Patient: Joseph Jimenez  Procedure(s) Performed: Procedure(s) (LRB): CORONARY ARTERY BYPASS GRAFTING (CABG) (N/A) TRANSESOPHAGEAL ECHOCARDIOGRAM (TEE) (N/A) CYSTOSCOPY (N/A)  Patient location during evaluation: SICU Anesthesia Type: General Level of consciousness: sedated Pain management: pain level controlled Vital Signs Assessment: post-procedure vital signs reviewed and stable Respiratory status: patient remains intubated per anesthesia plan Cardiovascular status: stable Anesthetic complications: no    Last Vitals:  Vitals:   10/10/16 1615 10/10/16 1630  BP:    Pulse: 80 80  Resp: 12 12  Temp: 36.2 C 36.1 C    Last Pain:  Vitals:   10/10/16 1630  TempSrc: Core (Comment)                 [name]

## 2016-10-11 ENCOUNTER — Encounter (HOSPITAL_COMMUNITY): Payer: Self-pay | Admitting: Thoracic Surgery (Cardiothoracic Vascular Surgery)

## 2016-10-11 ENCOUNTER — Inpatient Hospital Stay (HOSPITAL_COMMUNITY): Payer: BLUE CROSS/BLUE SHIELD

## 2016-10-11 LAB — BASIC METABOLIC PANEL
Anion gap: 7 (ref 5–15)
BUN: 12 mg/dL (ref 6–20)
CALCIUM: 7.8 mg/dL — AB (ref 8.9–10.3)
CO2: 23 mmol/L (ref 22–32)
CREATININE: 0.88 mg/dL (ref 0.61–1.24)
Chloride: 106 mmol/L (ref 101–111)
Glucose, Bld: 122 mg/dL — ABNORMAL HIGH (ref 65–99)
Potassium: 4.1 mmol/L (ref 3.5–5.1)
SODIUM: 136 mmol/L (ref 135–145)

## 2016-10-11 LAB — HEMOGLOBIN A1C
Hgb A1c MFr Bld: 5.6 % (ref 4.8–5.6)
Mean Plasma Glucose: 114 mg/dL

## 2016-10-11 LAB — CBC
HCT: 32.8 % — ABNORMAL LOW (ref 39.0–52.0)
HEMATOCRIT: 30.7 % — AB (ref 39.0–52.0)
HEMOGLOBIN: 11.2 g/dL — AB (ref 13.0–17.0)
HEMOGLOBIN: 10.6 g/dL — AB (ref 13.0–17.0)
MCH: 28.9 pg (ref 26.0–34.0)
MCH: 29.5 pg (ref 26.0–34.0)
MCHC: 34.1 g/dL (ref 30.0–36.0)
MCHC: 34.5 g/dL (ref 30.0–36.0)
MCV: 84.5 fL (ref 78.0–100.0)
MCV: 85.5 fL (ref 78.0–100.0)
PLATELETS: 149 10*3/uL — AB (ref 150–400)
Platelets: 117 10*3/uL — ABNORMAL LOW (ref 150–400)
RBC: 3.88 MIL/uL — ABNORMAL LOW (ref 4.22–5.81)
RBC: 3.59 MIL/uL — ABNORMAL LOW (ref 4.22–5.81)
RDW: 13 % (ref 11.5–15.5)
RDW: 13.3 % (ref 11.5–15.5)
WBC: 17.8 10*3/uL — ABNORMAL HIGH (ref 4.0–10.5)
WBC: 14.4 10*3/uL — ABNORMAL HIGH (ref 4.0–10.5)

## 2016-10-11 LAB — GLUCOSE, CAPILLARY
GLUCOSE-CAPILLARY: 109 mg/dL — AB (ref 65–99)
GLUCOSE-CAPILLARY: 118 mg/dL — AB (ref 65–99)
GLUCOSE-CAPILLARY: 115 mg/dL — AB (ref 65–99)
Glucose-Capillary: 105 mg/dL — ABNORMAL HIGH (ref 65–99)
Glucose-Capillary: 121 mg/dL — ABNORMAL HIGH (ref 65–99)
Glucose-Capillary: 125 mg/dL — ABNORMAL HIGH (ref 65–99)

## 2016-10-11 LAB — POCT I-STAT 3, ART BLOOD GAS (G3+)
BICARBONATE: 25.6 mmol/L (ref 20.0–28.0)
O2 Saturation: 98 %
PH ART: 7.389 (ref 7.350–7.450)
TCO2: 27 mmol/L (ref 0–100)
pCO2 arterial: 42.1 mmHg (ref 32.0–48.0)
pO2, Arterial: 109 mmHg — ABNORMAL HIGH (ref 83.0–108.0)

## 2016-10-11 LAB — CREATININE, SERUM
Creatinine, Ser: 0.92 mg/dL (ref 0.61–1.24)
GFR calc Af Amer: >60 mL/min (ref >60)

## 2016-10-11 LAB — POCT I-STAT, CHEM 8
BUN: 14 mg/dL (ref 6–20)
CALCIUM ION: 1.14 mmol/L — AB (ref 1.15–1.40)
CREATININE: 0.9 mg/dL (ref 0.61–1.24)
Chloride: 100 mmol/L — ABNORMAL LOW (ref 101–111)
GLUCOSE: 121 mg/dL — AB (ref 65–99)
HCT: 31 % — ABNORMAL LOW (ref 39.0–52.0)
HEMOGLOBIN: 10.5 g/dL — AB (ref 13.0–17.0)
Potassium: 3.6 mmol/L (ref 3.5–5.1)
Sodium: 138 mmol/L (ref 135–145)
TCO2: 23 mmol/L (ref 0–100)

## 2016-10-11 LAB — MAGNESIUM
MAGNESIUM: 2.3 mg/dL (ref 1.7–2.4)
MAGNESIUM: 2.3 mg/dL (ref 1.7–2.4)

## 2016-10-11 MED ORDER — ISOSORBIDE MONONITRATE ER 30 MG PO TB24
15.0000 mg | ORAL_TABLET | Freq: Every day | ORAL | Status: DC
  Administered 2016-10-11 – 2016-10-13 (×3): 15 mg via ORAL
  Filled 2016-10-11 (×3): qty 1

## 2016-10-11 MED ORDER — POTASSIUM CHLORIDE 10 MEQ/50ML IV SOLN: 10.0000 meq | INTRAVENOUS | Status: DC

## 2016-10-11 MED ORDER — POTASSIUM CHLORIDE CRYS ER 20 MEQ PO TBCR
40.0000 meq | EXTENDED_RELEASE_TABLET | Freq: Once | ORAL | Status: AC
  Administered 2016-10-11: 40 meq via ORAL
  Filled 2016-10-11: qty 2

## 2016-10-11 MED ORDER — ENOXAPARIN SODIUM 40 MG/0.4ML ~~LOC~~ SOLN
40.0000 mg | Freq: Every day | SUBCUTANEOUS | Status: DC
  Administered 2016-10-11 – 2016-10-12 (×2): 40 mg via SUBCUTANEOUS
  Filled 2016-10-11 (×2): qty 0.4

## 2016-10-11 MED ORDER — INSULIN ASPART 100 UNIT/ML ~~LOC~~ SOLN: 0.0000 [IU] | SUBCUTANEOUS | Status: DC

## 2016-10-11 MED ORDER — ALBUMIN HUMAN 5 % IV SOLN
12.5000 g | Freq: Once | INTRAVENOUS | Status: AC
  Administered 2016-10-11: 12.5 g via INTRAVENOUS
  Filled 2016-10-11: qty 250

## 2016-10-11 MED FILL — Mannitol IV Soln 20%: INTRAVENOUS | Qty: 500 | Status: AC

## 2016-10-11 MED FILL — Sodium Chloride IV Soln 0.9%: INTRAVENOUS | Qty: 2000 | Status: AC

## 2016-10-11 MED FILL — Heparin Sodium (Porcine) Inj 1000 Unit/ML: INTRAMUSCULAR | Qty: 30 | Status: AC

## 2016-10-11 MED FILL — Sodium Bicarbonate IV Soln 8.4%: INTRAVENOUS | Qty: 50 | Status: AC

## 2016-10-11 MED FILL — Electrolyte-R (PH 7.4) Solution: INTRAVENOUS | Qty: 4000 | Status: AC

## 2016-10-11 MED FILL — Lidocaine HCl IV Inj 20 MG/ML: INTRAVENOUS | Qty: 5 | Status: AC

## 2016-10-11 NOTE — Progress Notes (Signed)
Doing well. No complaints.   O: Vitals:   10/11/16 1000 10/11/16 1100  BP: (!) 99/58 107/60  Pulse: 81 85  Resp: (!) 21 (!) 25  Temp:      Intake/Output Summary (Last 24 hours) at 10/11/16 1204 Last data filed at 10/11/16 1100  Gross per 24 hour  Intake          6306.94 ml  Output             3250 ml  Net          3056.94 ml   NAD A&Ox3 GU - foley in place - urine clear   A/P - distal urethral stricture - s/p dilation and foley 11/16 -   -OK to remove foley tomorrow, but make sure pt is up and ambulatory and can void in toilet or urinal.  -I discussed the procedure and findings with him. He can f/u in the office in a few weeks if he's having any issues.

## 2016-10-11 NOTE — Progress Notes (Signed)
1 Day Post-Op Procedure(s) (LRB): CORONARY ARTERY BYPASS GRAFTING (CABG) (N/A) TRANSESOPHAGEAL ECHOCARDIOGRAM (TEE) (N/A) CYSTOSCOPY (N/A) Subjective: Has some incisional pain Denies nausea  Objective: Vital signs in last 24 hours: Temp:  [97 F (36.1 C)-99.7 F (37.6 C)] 99.5 F (37.5 C) (11/17 0800) Pulse Rate:  [73-82] 76 (11/17 0800) Cardiac Rhythm: Normal sinus rhythm (11/17 0800) Resp:  [12-29] 15 (11/17 0800) BP: (87-104)/(56-79) 99/56 (11/17 0800) SpO2:  [92 %-100 %] 92 % (11/17 0800) Arterial Line BP: (91-150)/(43-75) 113/52 (11/17 0800) FiO2 (%):  [40 %-50 %] 40 % (11/16 1744) Weight:  [220 lb 3.8 oz (99.9 kg)] 220 lb 3.8 oz (99.9 kg) (11/17 0500)  Hemodynamic parameters for last 24 hours: PAP: (20-40)/(6-19) 36/17 CO:  [3.6 L/min-6.1 L/min] 5.5 L/min CI:  [1.7 L/min/m2-2.9 L/min/m2] 2.6 L/min/m2  Intake/Output from previous day: 11/16 0701 - 11/17 0700 In: 6149.6 [P.O.:300; I.V.:4259.6; Blood:550; NG/GT:30; IV Piggyback:1010] Out: [id number] [Urine:2510; Blood:900; Chest Tube:520] Intake/Output this shift: Total I/O In: 87.7 [I.V.:87.7] Out: -   General appearance: alert, cooperative and no distress Neurologic: intact Heart: regular rate and rhythm Lungs: clear to auscultation bilaterally Abdomen: normal findings: soft, non-tender left hand neuro intact, brisk cap refill  Lab Results:  Recent Labs  10/10/16 2145 10/10/16 2212 10/11/16 0400  WBC 21.0*  --  17.8*  HGB 12.4* 11.2* 11.2*  HCT 35.1* 33.0* 32.8*  PLT 148*  --  149*   BMET:  Recent Labs  10/10/16 0150  10/10/16 2212 10/11/16 0400  NA 139  < > 138 136  K 4.1  < > 4.4 4.1  CL 103  < > 107 106  CO2 32  --   --  23  GLUCOSE 120*  < > 127* 122*  BUN 13  < > 14 12  CREATININE 1.01  < > 0.90 0.88  CALCIUM 9.1  --   --  7.8*  < > = values in this interval not displayed.  PT/INR:  Recent Labs  10/10/16 1540  LABPROT 16.9*  INR 1.36   ABG    Component Value Date/Time   PHART 7.388  10/10/2016 2209   HCO3 23.9 10/10/2016 2209   TCO2 24 10/10/2016 2212   ACIDBASEDEF 1.0 10/10/2016 2209   O2SAT 98.0 10/10/2016 2209   CBG (last 3)   Recent Labs  10/10/16 2215 10/10/16 2353 10/11/16 0351  GLUCAP 120* 128* 105*    Assessment/Plan: S/P Procedure(s) (LRB): CORONARY ARTERY BYPASS GRAFTING (CABG) (N/A) TRANSESOPHAGEAL ECHOCARDIOGRAM (TEE) (N/A) CYSTOSCOPY (N/A) -  CV- s/[p CABG x 5. Good hemodynamics dc swan  Imdur for radial graft  ASA. Betablocker, statin  RESP- IS  RENAL- creatinine and lytes OK-  ENDO- CBG well controlled, not on insulin drip  Change to SSI  SCD + enoxaparin for DVT prophylaxis  Anemia secondary to ABL- mild, follow  DC CT  OOB, ambulate   LOS: 3 days    Melrose Nakayama 10/11/2016

## 2016-10-11 NOTE — Op Note (Signed)
NAMELARS, HARTKOPF NO.:  [id number]  MEDICAL RECORD NO.:  [id number]  LOCATION:  2S08C                        FACILITY:  [facility]  PHYSICIAN:  Revonda Standard. Roxan Hockey, M.D.DATE OF BIRTH:  29-May-1956  DATE OF PROCEDURE:  10/10/2016 DATE OF DISCHARGE:                              OPERATIVE REPORT   PREOPERATIVE DIAGNOSIS:  Three-vessel coronary artery disease.  POSTOPERATIVE DIAGNOSIS:  Three-vessel coronary artery disease.  PROCEDURE:   Median sternotomy, extracorporeal circulation, Coronary artery bypass grafting x 5  Left internal mammary artery to LAD,  Saphenous vein graft to 1st diagonal,  Left radial artery to obtuse marginal 1,  Sequential saphenous vein graft to acute marginal and left   posterior descending Endoscopic vein harvest, right leg. Open left radial harvest.  SURGEON:  Revonda Standard. Roxan Hockey, M.D.  ASSISTANT:  John Giovanni, P.A.-C.  SECOND ASSISTANT:  Nicholes Rough, PA  ANESTHESIA:  General.  FINDINGS:  Transesophageal echocardiography revealed overall preserved left ventricular function with no significant valvular pathology. Good quality conduits.  Targets were small, fair quality.  CLINICAL NOTE:  Mr. Giustino is a 60 year old man with a strong family history of coronary disease.  He recently has been having exertional angina.  He underwent a stress test that was stopped early due to ventricular ectopy and ST depression.  He then underwent cardiac catheterization, which revealed severe three-vessel coronary disease. He was advised to undergo coronary artery bypass grafting.  The indications, risks, benefits, and alternatives were discussed in detail with the patient.  He understood and accepted the risks and agreed to proceed.  OPERATIVE NOTE:  Mr. Lanctot was brought to the preoperative holding area on October 10, 2016. Anesthesia placed a Swan-Ganz catheter and an arterial blood pressure monitoring line.   The Allen's test was  confirmed with pulse ox in the preoperative holding area. There was minimal change in the pulse oximetry tracing with radial Compression. He was taken to the operating room, anesthetized, and intubated.  Dr. Suzette Battiest performed a transesophageal echocardiogram.  Findings as previously noted. Intravenous antibiotics were administered. The chest, abdomen, legs, and left arm were prepped and draped in usual sterile fashion.   The conduits were harvested simultaneously.  A median sternotomy was performed. The left internal mammary artery was harvested under direct vision.  Simultaneously, an incision was made in the medial aspect of the right leg, just below the knee.  The greater saphenous vein was identified and was harvested from the right thigh endoscopically. And, Finally, an incision was made over the volar aspect of the left wrist.  A short distal incision was made initially.  With proximal occlusion, there was a good Doppler signal in the palmar arch and a pulse palpable in the distal radial artery.  The incision was extended to just below the antecubital fossa and the radial artery was harvested using the Harmonic Scalpel.  2000 units of heparin was administered during the vessel harvest.  The remainder of the full heparin dose was given after the left arm incision was closed.  After harvesting the conduits, the leg incision and left arm incision were closed in standard fashion.  The left arm was tucked to the patient's side.  A sternal  retractor was placed.  The pericardium was opened.  The ascending aorta was inspected. It was normal caliber with no evidence of atherosclerotic disease.  The aorta was cannulated via concentric 2-0 Ethibond pursestring sutures after confirming anticoagulation with ACT measurement.  A dual stage venous cannula was placed via a pursestring suture in the right atrial appendage.  Cardiopulmonary bypass was initiated.  Flows were maintained per  protocol.  The patient was cooled to 32 degrees Celsius.  The coronary arteries were inspected and anastomotic sites were chosen.  The conduits were inspected and cut to length.  A foam pad was placed in the pericardium to insulate the heart. A temperature probe was placed in myocardial septum and a cardioplegia cannula was placed in the ascending aorta.  The aorta was crossclamped. The left ventricle was emptied via the aortic root vent.  Cardiac arrest then was achieved with combination of cold antegrade blood cardioplegia and topical iced saline. There was a rapid diastolic arrest, but myocardial septal cooling was initially sluggish. It eventually did cool to less than 10 degrees Celsius.  1.5 L of cardioplegia was administered initially.    The heart was elevated exposing obtuse marginal #1.  This vessel was a 1.5 mm good quality target.  An end-to-side anastomosis was performed.  The distal end of the left radial artery was beveled, and was anastomosed end-to- side to the OM1 with a running 8-0 Prolene suture.  All anastomoses were probed proximally and distally at their completion to ensure patency. At the completion of anastomosis, additional cardioplegia was administered down the aortic root and there was good backbleeding from the radial artery.  A bulldog clamp was placed across the proximal end.  Next, a reversed saphenous vein graft was placed end-to-side to the first diagonal.  This was a 1 mm fair quality target.  The second diagonal was too small to graft.  The vein was of good quality.  It was anastomosed end-to-side with a running 7-0 Prolene suture.  A 1-mm probe did pass proximally and distally.  Cardioplegia was administered and there was good flow and good hemostasis.  After giving additional cardioplegia down the aortic root, a saphenous vein was placed sequentially to the acute marginal branch of the right coronary and the left posterior descending coronary artery.  The  acute marginal accepted a 1.5-mm probe.  The posterior descending only a 1.0- mm probe.  A side-to-side anastomosis was done to the acute marginal and an end-to-side to the posterior descending.  Both were done with running 7- 0 Prolene sutures.  Both anastomoses probed easily at their completion. Cardioplegia was administered.  There was good flow through the graft and good hemostasis at both anastomoses.  The left internal mammary artery was brought through a window in the pericardium.  The distal end was beveled, it was anastomosed end-to-side to the distal LAD.  The LAD was relatively small vessel.  It did accept a 1.5 mm probe, but just barely.  There was some plaque distal to the anastomosis, but the probe passed those areas.  It was a fair quality target.  The left mammary was a good quality conduit.  An end-to-side anastomosis was performed with a running 8-0 Prolene suture.  At completion of the mammary to LAD anastomosis, the bulldog clamp was removed.  Rapid septal rewarming was noted.  The bulldog clamp was replaced.  The mammary pedicle was tacked to the epicardial surface of the heart with 6-0 Prolene sutures.  Additional cardioplegia was administered.  The vein grafts were cut to length.  The cardioplegia cannula was removed from the ascending aorta. The proximal vein graft anastomoses were performed to 4.5 mm punch aortotomies with running 6-0 Prolene sutures.  At the completion of the final proximal anastomosis, the patient was placed in Trendelenburg position.  Lidocaine was administered.  The aortic root was de-aired. The bulldog clamp was again removed from the left mammary artery.  After de-airing the aortic root, the aortic crossclamp was removed. Total crossclamp time was 88 minutes.  The patient required a single defibrillation with 10 joules and then was in sinus bradycardia.  Bulldog clamps were placed proximally and distally on the vein graft to the diagonal.   A venotomy was made in the proximal portion of the vein.  The proximal end of the radial artery was beveled, and it was anastomosed end-to-side to the vein with a running 7-0 Prolene suture.  At the completion of anastomosis, the radial and vein were de-aired prior to tying the suture.  This Y-graft was performed because the radial appeared too small to anastomose directly to the ascending aorta.  While rewarming was completed, all proximal and distal anastomoses were inspected for hemostasis.  Epicardial pacing wires were placed on the right ventricle and right atrium and DDD pacing was initiated at 80 beats per minute.  When the patient had rewarmed to a core temperature of 37 degrees Celsius, he was weaned from cardiopulmonary bypass on the first attempt without difficulty.  He was DDD paced and on no inotropic support.  Total bypass time was 132 minutes.  A test dose of protamine was administered and was well tolerated.  The atrial and aortic cannulae were removed.  The remainder of the protamine was administered without incident.  The chest was irrigated with warm saline.  Post bypass transesophageal echocardiography was unchanged from the prebypass study.  Hemostasis was achieved.  The pericardium was reapproximated over the ascending aorta and base of the heart with interrupted 3-0 silk sutures.  It came together easily without tension or kinking the underlying grafts.  The left pleural and mediastinal chest tubes were placed through separate subcostal incisions.  The sternum was closed with a combination of single and double heavy gauge stainless steel wires.  The pectoralis fascia, subcutaneous tissue, and skin were closed in standard fashion.  All sponge, needle, and instrument counts were correct at the end of the procedure.  The patient was taken from the operating room to the Surgical Intensive Care Unit in good condition.     Revonda Standard Roxan Hockey,  M.D.     SCH/MEDQ  D:  10/10/2016  T:  10/11/2016  Job:  [id number]

## 2016-10-11 NOTE — Progress Notes (Signed)
[facility] BRIEF SICU PROGRESS NOTE  1 Day Post-Op  S/P Procedure(s) (LRB): CORONARY ARTERY BYPASS GRAFTING (CABG) (N/A) TRANSESOPHAGEAL ECHOCARDIOGRAM (TEE) (N/A) CYSTOSCOPY (N/A)   Doing well.  Mild soreness.  Ambulated around SICU NSR w/ stable BP Breathing comfortably w/ O2 sats 94% on RA UOP adequate  Plan: Continue current plan  Rexene Alberts, MD 10/11/2016 5:57 PM

## 2016-10-11 NOTE — Care Management Note (Signed)
Case Management Note Original Note created Tomi Bamberger 10/09/16  Patient Details  Name: Joseph Jimenez MRN: [id number] Date of Birth: 21-May-1956  Subjective/Objective:   Has 3 vessel disease for CABG tomorrow.  PTA indep,   NCM will cont to follow for dc needs.                 Action/Plan:   Expected Discharge Date:                  Expected Discharge Plan:  Home/Self Care  In-House Referral:     Discharge planning Services  CM Consult  Post Acute Care Choice:    Choice offered to:     DME Arranged:    DME Agency:     HH Arranged:    HH Agency:     Status of Service:  In process, will continue to follow  If discussed at Long Length of Stay Meetings, dates discussed:    Additional Comments: 10/11/2016 S/p CABG.  Pt is from home wife completely independent .  Pts family will provide 24 hour supervision as recommended.  Maryclare Labrador, RN 10/11/2016, 4:37 PM

## 2016-10-12 ENCOUNTER — Inpatient Hospital Stay (HOSPITAL_COMMUNITY): Payer: BLUE CROSS/BLUE SHIELD

## 2016-10-12 DIAGNOSIS — Z951 Presence of aortocoronary bypass graft: Secondary | ICD-10-CM

## 2016-10-12 LAB — POCT I-STAT 3, ART BLOOD GAS (G3+)
Acid-base deficit: 1 mmol/L (ref 0.0–2.0)
Acid-base deficit: 3 mmol/L — ABNORMAL HIGH (ref 0.0–2.0)
BICARBONATE: 25.5 mmol/L (ref 20.0–28.0)
Bicarbonate: 22.9 mmol/L (ref 20.0–28.0)
O2 SAT: 97 %
O2 Saturation: 96 %
PCO2 ART: 43.2 mmHg (ref 32.0–48.0)
PO2 ART: 88 mmHg (ref 83.0–108.0)
PO2 ART: 90 mmHg (ref 83.0–108.0)
Patient temperature: 36.7
Patient temperature: 36.3
TCO2: 27 mmol/L (ref 0–100)
TCO2: 24 mmol/L (ref 0–100)
pCO2 arterial: 47.2 mmHg (ref 32.0–48.0)
pH, Arterial: 7.338 — ABNORMAL LOW (ref 7.350–7.450)
pH, Arterial: 7.328 — ABNORMAL LOW (ref 7.350–7.450)

## 2016-10-12 LAB — BASIC METABOLIC PANEL
Anion gap: 6 (ref 5–15)
BUN: 13 mg/dL (ref 6–20)
CO2: 26 mmol/L (ref 22–32)
CREATININE: 0.86 mg/dL (ref 0.61–1.24)
Calcium: 8.1 mg/dL — ABNORMAL LOW (ref 8.9–10.3)
Chloride: 105 mmol/L (ref 101–111)
GFR calc non Af Amer: >60 mL/min (ref >60)
Glucose, Bld: 120 mg/dL — ABNORMAL HIGH (ref 65–99)
POTASSIUM: 4.2 mmol/L (ref 3.5–5.1)
Sodium: 137 mmol/L (ref 135–145)

## 2016-10-12 LAB — GLUCOSE, CAPILLARY
GLUCOSE-CAPILLARY: 121 mg/dL — AB (ref 65–99)
Glucose-Capillary: 125 mg/dL — ABNORMAL HIGH (ref 65–99)

## 2016-10-12 LAB — CBC
HEMATOCRIT: 29.9 % — AB (ref 39.0–52.0)
Hemoglobin: 10.1 g/dL — ABNORMAL LOW (ref 13.0–17.0)
MCH: 29.1 pg (ref 26.0–34.0)
MCHC: 33.8 g/dL (ref 30.0–36.0)
MCV: 86.2 fL (ref 78.0–100.0)
PLATELETS: 105 10*3/uL — AB (ref 150–400)
RBC: 3.47 MIL/uL — AB (ref 4.22–5.81)
RDW: 13.4 % (ref 11.5–15.5)
WBC: 12.3 10*3/uL — AB (ref 4.0–10.5)

## 2016-10-12 MED ORDER — ZOLPIDEM TARTRATE 5 MG PO TABS: 5.0000 mg | ORAL_TABLET | Freq: Every evening | ORAL | Status: DC | PRN

## 2016-10-12 MED ORDER — SODIUM CHLORIDE 0.9% FLUSH: 3.0000 mL | INTRAVENOUS | Status: DC | PRN

## 2016-10-12 MED ORDER — MOVING RIGHT ALONG BOOK
Freq: Once | Status: DC
  Filled 2016-10-12: qty 1

## 2016-10-12 MED ORDER — SODIUM CHLORIDE 0.9% FLUSH
3.0000 mL | Freq: Two times a day (BID) | INTRAVENOUS | Status: DC
  Administered 2016-10-12: 3 mL via INTRAVENOUS

## 2016-10-12 MED ORDER — METOPROLOL SUCCINATE ER 25 MG PO TB24
25.0000 mg | ORAL_TABLET | Freq: Every day | ORAL | Status: DC
  Administered 2016-10-12 – 2016-10-13 (×2): 25 mg via ORAL
  Filled 2016-10-12 (×2): qty 1

## 2016-10-12 MED ORDER — MAGNESIUM HYDROXIDE 400 MG/5ML PO SUSP: 30.0000 mL | Freq: Every day | ORAL | Status: DC | PRN

## 2016-10-12 MED ORDER — SODIUM CHLORIDE 0.9 % IV SOLN: 250.0000 mL | INTRAVENOUS | Status: DC | PRN

## 2016-10-12 MED ORDER — ALUM & MAG HYDROXIDE-SIMETH 200-200-20 MG/5ML PO SUSP
15.0000 mL | ORAL | Status: DC | PRN
Start: 2016-10-12 — End: 2016-10-13

## 2016-10-12 NOTE — Progress Notes (Addendum)
2 Days Post-Op Procedure(s) (LRB): CORONARY ARTERY BYPASS GRAFTING (CABG) (N/A) TRANSESOPHAGEAL ECHOCARDIOGRAM (TEE) (N/A) CYSTOSCOPY (N/A) Subjective: Only hurts when getting out of bed  Objective: Vital signs in last 24 hours: Temp:  [98.4 F (36.9 C)-98.8 F (37.1 C)] 98.4 F (36.9 C) (11/18 0805) Pulse Rate:  [73-89] 80 (11/18 0700) Cardiac Rhythm: Normal sinus rhythm (11/18 0730) Resp:  [7-32] 20 (11/18 0700) BP: (92-140)/(55-70) 109/60 (11/18 0700) SpO2:  [88 %-95 %] 92 % (11/18 0700) Arterial Line BP: (115)/(49) 115/49 (11/17 0900) Weight:  [219 lb 2.2 oz (99.4 kg)] 219 lb 2.2 oz (99.4 kg) (11/18 0500)  Hemodynamic parameters for last 24 hours:    Intake/Output from previous day: 11/17 0701 - 11/18 0700 In: 957.3 [P.O.:300; I.V.:557.3; IV Piggyback:100] Out: 845 [Urine:755; Chest Tube:90] Intake/Output this shift: No intake/output data recorded.  General appearance: alert, cooperative and no distress Neurologic: intact Heart: regular rate and rhythm Lungs: diminished breath sounds bibasilar Abdomen: normal findings: soft, non-tender  Lab Results:  Recent Labs  10/11/16 1620 10/12/16 0420  WBC 14.4* 12.3*  HGB 10.6* 10.1*  HCT 30.7* 29.9*  PLT 117* 105*   BMET:  Recent Labs  10/11/16 0400 10/11/16 1618 10/11/16 1620 10/12/16 0420  NA 136 138  --  137  K 4.1 3.6  --  4.2  CL 106 100*  --  105  CO2 23  --   --  26  GLUCOSE 122* 121*  --  120*  BUN 12 14  --  13  CREATININE 0.88 0.90 0.92 0.86  CALCIUM 7.8*  --   --  8.1*    PT/INR:  Recent Labs  10/10/16 1540  LABPROT 16.9*  INR 1.36   ABG    Component Value Date/Time   PHART 7.388 10/10/2016 2209   HCO3 23.9 10/10/2016 2209   TCO2 23 10/11/2016 1618   ACIDBASEDEF 1.0 10/10/2016 2209   O2SAT 98.0 10/10/2016 2209   CBG (last 3)   Recent Labs  10/11/16 2329 10/12/16 0358 10/12/16 0802  GLUCAP 115* 121* 125*    Assessment/Plan: S/P Procedure(s) (LRB): CORONARY ARTERY BYPASS  GRAFTING (CABG) (N/A) TRANSESOPHAGEAL ECHOCARDIOGRAM (TEE) (N/A) CYSTOSCOPY (N/A) Plan for transfer to step-down: see transfer orders  POD # 2 Doing well CV- stable in SR  ASA, statin, beta blocker  BP too low to try adding ACE-I at this point  On Imdur for radial graft  RESP- continue IS  RENAL- creatinine normal, weight up  Will allow to diurese spontaneously unless he becomes symptomatic  ENDO_ CBG well controlled  GI- no nausea, but has not tried solid food yet   SCD + enoxaparin for DVT prophylaxis   LOS: 4 days    Melrose Nakayama 10/12/2016

## 2016-10-12 NOTE — Progress Notes (Signed)
Patient transferred to 2W20 via wheelchair. SCDs taken and placed at the end of the bed. Patient sat in recliner. Family at bedside. Ring returned from secruity, pt wearing it.

## 2016-10-13 LAB — BASIC METABOLIC PANEL
ANION GAP: 11 (ref 5–15)
BUN: 11 mg/dL (ref 6–20)
CHLORIDE: 104 mmol/L (ref 101–111)
CO2: 23 mmol/L (ref 22–32)
Calcium: 8.5 mg/dL — ABNORMAL LOW (ref 8.9–10.3)
Creatinine, Ser: 0.87 mg/dL (ref 0.61–1.24)
GFR calc Af Amer: >60 mL/min (ref >60)
GLUCOSE: 106 mg/dL — AB (ref 65–99)
POTASSIUM: 3.8 mmol/L (ref 3.5–5.1)
Sodium: 138 mmol/L (ref 135–145)

## 2016-10-13 LAB — CBC
HEMATOCRIT: 32.7 % — AB (ref 39.0–52.0)
HEMOGLOBIN: 11.3 g/dL — AB (ref 13.0–17.0)
MCH: 29.7 pg (ref 26.0–34.0)
MCHC: 34.6 g/dL (ref 30.0–36.0)
MCV: 85.8 fL (ref 78.0–100.0)
Platelets: 151 10*3/uL (ref 150–400)
RBC: 3.81 MIL/uL — AB (ref 4.22–5.81)
RDW: 13.3 % (ref 11.5–15.5)
WBC: 11.7 10*3/uL — AB (ref 4.0–10.5)

## 2016-10-13 MED ORDER — ISOSORBIDE MONONITRATE ER 30 MG PO TB24: 15.0000 mg | ORAL_TABLET | Freq: Every day | ORAL | 0 refills | Status: DC

## 2016-10-13 MED ORDER — ASPIRIN 325 MG PO TBEC: 325.0000 mg | DELAYED_RELEASE_TABLET | Freq: Every day | ORAL | 0 refills | Status: DC

## 2016-10-13 MED ORDER — OXYCODONE HCL 5 MG PO TABS: 5.0000 mg | ORAL_TABLET | ORAL | 0 refills | Status: DC | PRN

## 2016-10-13 MED ORDER — METOPROLOL SUCCINATE ER 25 MG PO TB24: 25.0000 mg | ORAL_TABLET | Freq: Every day | ORAL | 3 refills | Status: DC

## 2016-10-13 MED ORDER — ATORVASTATIN CALCIUM 80 MG PO TABS: 80.0000 mg | ORAL_TABLET | Freq: Every day | ORAL | 3 refills | Status: DC

## 2016-10-13 NOTE — Discharge Summary (Signed)
Physician Discharge Summary  Patient ID: TAELOR FRAGOZA MRN: [id number] DOB/AGE: Jun 25, 1956 60 y.o.  Admit date: 10/08/2016 Discharge date: 10/13/2016  Admission Diagnoses:  Patient Active Problem List   Diagnosis Date Noted  . Unstable angina pectoris ([place]) 10/08/2016  . Coronary artery disease involving native coronary artery of native heart with unstable angina pectoris ([place]) 10/08/2016  . Other hyperlipidemia 10/08/2016  . Family history of early CAD 10/08/2016  . Unstable angina ([place]) 10/08/2016   Discharge Diagnoses:   3 vessel CAD with unstable angina Patient Active Problem List   Diagnosis Date Noted  . S/P CABG x 5 10/10/2016  . Unstable angina pectoris ([place]) 10/08/2016  . Coronary artery disease involving native coronary artery of native heart with unstable angina pectoris ([place]) 10/08/2016  . Other hyperlipidemia 10/08/2016  . Family history of early CAD 10/08/2016  . Unstable angina ([place]) 10/08/2016   Discharged Condition: good  History of Present Illness:  Mr. Stidham is a 60 yo white male with known history of hyperlipidemia, nephrolithiasis, heart murmur and prostate cancer and a strong family history (maternal) of CAD. He presents with a complaints of chest pain. The first episode was last summer while playing golf. He described the pain as a dull aching pain in mid chest which lasted about 20 minutes, but was able to finish his round. He has had 4 additional episodes since then with the most recent occuring while playing paddle tennis. This episode lasted about 5 minutes and resolved with rest. He saw Dr. Harrington Challenger who performed  a stress test which was stopped early after 6 minutes due to ST changes and ventricular ectopy.  Due to this it was felt cardiac catheterization should be performed.  This showed severe 3 vessel CAD with preserved LV function.  It was felt coronary bypass grafting would be indicated and he was referred to [facility] for evaluation.  He saw Dr.  Roxan Hockey who was in agreement the patient would benefit from surgical revascularization.  The risks and benefits of the procedure were explained to the patient and he was agreeable to proceed.  Hospital Course:   Mr. Healan presented to [facility] on 10/10/2016.  He was taken to the operating room and underwent CABG x 5 utilizing LIMA to LAD, SVG to Diagonal, Left radial artery to OM, and SVG to Acute marginal and left posterior.  He also underwent endoscopic harvest of greater saphenous vein from right leg and open radial artery harvest.  He tolerated the procedure without difficulty and was taken to the SICU in stable condition.  He was extubated the evening of surgery.  During his stay in the SICU the patient's chest tubes and arterial lines were removed without difficulty.  He was maintaining NSR and ambulating independently in the SICU.  He was felt medically stable for transfer to the telemetry unit on POD #2.  He continued to make progress.  His BP remained labile and he was not started on an ACE inhibitor.  He is on Imdur for his radial artery graft.  He continued to maintain NSR and his pacing wires were removed without difficulty.  His weight was trending down without lasix.  He continues to ambulate independently.  He is tolerating a heart healthy diet.  His pain is well controlled.  He is medically stable for discharge home today.  Consults: None  Significant Diagnostic Studies: angiography:    LV end diastolic pressure is normal.  The left ventricular systolic function is normal.  The left  ventricular ejection fraction is 55-65% by visual estimate.  Prox RCA lesion, 90 %stenosed.  Mid RCA lesion, 80 %stenosed.  1st Mrg lesion, 90 %stenosed.  Mid Cx lesion, 85 %stenosed.  Prox Cx to Mid Cx lesion, 40 %stenosed.  Ost 1st Diag lesion, 90 %stenosed.  Prox LAD lesion, 95 %stenosed.  Mid LAD lesion, 50 %stenosed.  Ost 2nd Diag lesion, 80 %stenosed   Treatments:  surgery:   Median sternotomy, extracorporeal circulation, coronary artery bypass grafting x5 (left internal mammary artery to LAD, saphenous vein graft to 1st diagonal, left radial artery to obtuse marginal 1, saphenous vein graft to acute marginal and left posterior descending), endoscopic vein harvest, right leg, open left radial harvest.  Disposition: 01-Home or Self Care   Discharge Medications:  The patient has been discharged on:   1.Beta Blocker:  Yes [x   ]                              No   [   ]                              If No, reason:  2.Ace Inhibitor/ARB: Yes [   ]                                     No  [   x ]                                     If No, reason: labile BP  3.Statin:   Yes [x]                   No  [   ]                  If No, reason:  4.Ecasa:  Yes  [ x  ]                  No   [   ]                  If No, reason:        Medication List    STOP taking these medications   aspirin 81 MG chewable tablet Replaced by:  aspirin 325 MG EC tablet     TAKE these medications   aspirin 325 MG EC tablet Take 1 tablet (325 mg total) by mouth daily. Replaces:  aspirin 81 MG chewable tablet   atorvastatin 80 MG tablet Commonly known as:  LIPITOR Take 1 tablet (80 mg total) by mouth daily at 6 PM.   isosorbide mononitrate 30 MG 24 hr tablet Commonly known as:  IMDUR Take 0.5 tablets (15 mg total) by mouth daily. For 30 days   metoprolol succinate 25 MG 24 hr tablet Commonly known as:  TOPROL-XL Take 1 tablet (25 mg total) by mouth daily.   nitroGLYCERIN 0.4 MG SL tablet Commonly known as:  NITROSTAT Place 1 tablet (0.4 mg total) under the tongue every 5 (five) minutes as needed.   oxyCODONE 5 MG immediate release tablet Commonly known as:  Oxy IR/ROXICODONE Take 1-2 tablets (5-10 mg total) by mouth every 4 (four) hours as needed for severe  pain.      Follow-up Information    ESKRIDGE, MATTHEW, MD Follow up.   Specialty:   Urology Why:  4-6 weeks  Contact information: [place] [address] [phone]        Melrose Nakayama, MD Follow up in 4 week(s).   Specialty:  Cardiothoracic Surgery Why:  Office will contact you with appointment.  Please get a CXR 30 min prior to your appointment with Dr. Roxan Hockey, at [facility] located on the first floor of our office building Contact information: [place] [phone]        Dorris Carnes, MD Follow up.   Specialty:  Cardiology Why:  Please contact office in 2 weeks if you do not receive an appointment  Contact information: [place] [address] [phone]           Signed: Ellwood Handler 10/13/2016, 12:45 PM

## 2016-10-13 NOTE — Progress Notes (Addendum)
      [address]       [place] [address]             [phone]      3 Days Post-Op Procedure(s) (LRB): CORONARY ARTERY BYPASS GRAFTING (CABG) (N/A) TRANSESOPHAGEAL ECHOCARDIOGRAM (TEE) (N/A) CYSTOSCOPY (N/A)   Subjective:  Patient looks great.  He has no complaints.  He has been able to tolerate a solid diet without N/V.  He has already ambulated around the unit several times unassisted this morning.    Objective: Vital signs in last 24 hours: Temp:  [97.9 F (36.6 C)-99.2 F (37.3 C)] 98.1 F (36.7 C) (11/19 0413) Pulse Rate:  [79-94] 94 (11/19 0413) Cardiac Rhythm: Normal sinus rhythm (11/18 2018) Resp:  [17-23] 18 (11/19 0413) BP: (112-136)/(62-73) 136/71 (11/19 0413) SpO2:  [92 %-96 %] 96 % (11/19 0413) Weight:  [213 lb 12.8 oz (97 kg)] 213 lb 12.8 oz (97 kg) (11/19 0413)  Intake/Output from previous day: 11/18 0701 - 11/19 0700 In: 640 [P.O.:600; I.V.:40] Out: 1215 [Urine:1215]  General appearance: alert, cooperative and no distress Heart: regular rate and rhythm Lungs: clear to auscultation bilaterally Abdomen: soft, non-tender; bowel sounds normal; no masses,  no organomegaly Extremities: edema trace Wound: clean and dry  Lab Results:  Recent Labs  10/12/16 0420 10/13/16 0438  WBC 12.3* 11.7*  HGB 10.1* 11.3*  HCT 29.9* 32.7*  PLT 105* 151   BMET:  Recent Labs  10/12/16 0420 10/13/16 0438  NA 137 138  K 4.2 3.8  CL 105 104  CO2 26 23  GLUCOSE 120* 106*  BUN 13 11  CREATININE 0.86 0.87  CALCIUM 8.1* 8.5*    PT/INR:  Recent Labs  10/10/16 1540  LABPROT 16.9*  INR 1.36   ABG    Component Value Date/Time   PHART 7.388 10/10/2016 2209   HCO3 23.9 10/10/2016 2209   TCO2 23 10/11/2016 1618   ACIDBASEDEF 1.0 10/10/2016 2209   O2SAT 98.0 10/10/2016 2209   CBG (last 3)   Recent Labs  10/11/16 2329 10/12/16 0358 10/12/16 0802  GLUCAP 115* 121* 125*    Assessment/Plan: S/P Procedure(s) (LRB): CORONARY  ARTERY BYPASS GRAFTING (CABG) (N/A) TRANSESOPHAGEAL ECHOCARDIOGRAM (TEE) (N/A) CYSTOSCOPY (N/A)  1. CV- NSR, BP controlled- continue Lopressor, Imdur for radial graft 2. Pulm- no acute issues, off oxygen, good use of IS 3. Renal- creatinine WNL, potassium okay... Weight is trending down not currently on Lasix 4. Expected post operative blood loss anemia, mild Hgb stable at 11.3 5. Dispo- patient stable, will d/c EPW today, plan to d/c home later this afternoon   LOS: 5 days    BARRETT, ERIN 10/13/2016  I have seen and examined the patient and agree with the assessment and plan as outlined.  Rexene Alberts, MD 10/13/2016 10:43 AM

## 2016-10-13 NOTE — Discharge Instructions (Signed)
Coronary Artery Bypass Grafting, Care After °Refer to this sheet in the next few weeks. These instructions provide you with information on caring for yourself after your procedure. Your health care provider may also give you more specific instructions. Your treatment has been planned according to current medical practices, but problems sometimes occur. Call your health care provider if you have any problems or questions after your procedure. °WHAT TO EXPECT AFTER THE PROCEDURE °Recovery from surgery will be different for everyone. Some people feel well after 3 or 4 weeks, while for others it takes longer. After your procedure, it is typical to have the following: °· Nausea and a lack of appetite.   °· Constipation. °· Weakness and fatigue.   °· Depression or irritability.   °· Pain or discomfort at your incision site. °HOME CARE INSTRUCTIONS °· Take medicines only as directed by your health care provider. Do not stop taking medicines or start any new medicines without first checking with your health care provider. °· Take your pulse as directed by your health care provider. °· Perform deep breathing as directed by your health care provider. If you were given a device called an incentive spirometer, use it to practice deep breathing several times a day. Support your chest with a pillow or your arms when you take deep breaths or cough. °· Keep incision areas clean, dry, and protected. Remove or change any bandages (dressings) only as directed by your health care provider. You may have skin adhesive strips over the incision areas. Do not take the strips off. They will fall off on their own. °· Check incision areas daily for any swelling, redness, or drainage. °· If incisions were made in your legs, do the following: °¨ Avoid crossing your legs.   °¨ Avoid sitting for long periods of time. Change positions every 30 minutes.   °¨ Elevate your legs when you are sitting. °· Wear compression stockings as directed by your  health care provider. These stockings help keep blood clots from forming in your legs. °· Take showers once your health care provider approves. Until then, only take sponge baths. Pat incisions dry. Do not rub incisions with a washcloth or towel. Do not take baths, swim, or use a hot tub until your health care provider approves. °· Eat foods that are high in fiber, such as raw fruits and vegetables, whole grains, beans, and nuts. Meats should be lean cut. Avoid canned, processed, and fried foods. °· Drink enough fluid to keep your urine clear or pale yellow. °· Weigh yourself every day. This helps identify if you are retaining fluid that may make your heart and lungs work harder. °· Rest and limit activity as directed by your health care provider. You may be instructed to: °¨ Stop any activity at once if you have chest pain, shortness of breath, irregular heartbeats, or dizziness. Get help right away if you have any of these symptoms. °¨ Move around frequently for short periods or take short walks as directed by your health care provider. Increase your activities gradually. You may need physical therapy or cardiac rehabilitation to help strengthen your muscles and build your endurance. °¨ Avoid lifting, pushing, or pulling anything heavier than 10 lb (4.5 kg) for at least 6 weeks after surgery. °· Do not drive until your health care provider approves.  °· Ask your health care provider when you may return to work. °· Ask your health care provider when you may resume sexual activity. °· Keep all follow-up visits as directed by your health care   provider. This is important. °SEEK MEDICAL CARE IF: °· You have swelling, redness, increasing pain, or drainage at the site of an incision. °· You have a fever. °· You have swelling in your ankles or legs. °· You have pain in your legs.   °· You gain 2 or more pounds (0.9 kg) a day. °· You are nauseous or vomit. °· You have diarrhea.  °SEEK IMMEDIATE MEDICAL CARE IF: °· You have  chest pain that goes to your jaw or arms. °· You have shortness of breath.   °· You have a fast or irregular heartbeat.   °· You notice a "clicking" in your breastbone (sternum) when you move.   °· You have numbness or weakness in your arms or legs. °· You feel dizzy or light-headed.   °MAKE SURE YOU: °· Understand these instructions. °· Will watch your condition. °· Will get help right away if you are not doing well or get worse. °This information is not intended to replace advice given to you by your health care provider. Make sure you discuss any questions you have with your health care provider. °Document Released: 05/31/2005 Document Revised: 12/02/2014 Document Reviewed: 04/20/2013 °Elsevier Interactive Patient Education © 2017 [employer] ° ° °Endoscopic Saphenous Vein Harvesting, Care After °Introduction °Refer to this sheet in the next few weeks. These instructions provide you with information about caring for yourself after your procedure. Your health care provider may also give you more specific instructions. Your treatment has been planned according to current medical practices, but problems sometimes occur. Call your health care provider if you have any problems or questions after your procedure. °What can I expect after the procedure? °After the procedure, it is common to have: °· Pain. °· Bruising. °· Swelling. °· Numbness. °Follow these instructions at home: °Medicine °· Take over-the-counter and prescription medicines only as told by your health care provider. °· Do not drive or operate heavy machinery while taking prescription pain medicine. °Incision care °· Follow instructions from your health care provider about how to take care of the cut made during surgery (incision). Make sure you: °¨ Wash your hands with soap and water before you change your bandage (dressing). If soap and water are not available, use hand sanitizer. °¨ Change your dressing as told by your health care provider. °¨ Leave  stitches (sutures), skin glue, or adhesive strips in place. These skin closures may need to be in place for 2 weeks or longer. If adhesive strip edges start to loosen and curl up, you may trim the loose edges. Do not remove adhesive strips completely unless your health care provider tells you to do that. °· Check your incision area every day for signs of infection. Check for: °¨ More redness, swelling, or pain. °¨ More fluid or blood. °¨ Warmth. °¨ Pus or a bad smell. °General instructions °· Raise (elevate) your legs above the level of your heart while you are sitting or lying down. °· Do any exercises your health care providers have given you. These may include deep breathing, coughing, and walking exercises. °· Do not shower, take baths, swim, or use a hot tub unless told by your health care provider. °· Wear your elastic stocking if told by your health care provider. °· Keep all follow-up visits as told by your health care provider. This is important. °Contact a health care provider if: °· Medicine does not help your pain. °· Your pain gets worse. °· You have new leg bruises or your leg bruises get bigger. °· You have   a fever. °· Your leg feels numb. °· You have more redness, swelling, or pain around your incision. °· You have more fluid or blood coming from your incision. °· Your incision feels warm to the touch. °· You have pus or a bad smell coming from your incision. °Get help right away if: °· Your pain is severe. °· You develop pain, tenderness, warmth, redness, or swelling in any part of your leg. °· You have chest pain. °· You have trouble breathing. °This information is not intended to replace advice given to you by your health care provider. Make sure you discuss any questions you have with your health care provider. °Document Released: 07/24/2011 Document Revised: 04/18/2016 Document Reviewed: 09/25/2015 °© 2017 Elsevier ° ° °

## 2016-10-13 NOTE — Progress Notes (Signed)
Patient and wife watched cardiac discharge video. Pt/family given discharge instructions, medication lists, follow up appointments, and when to call the doctor.  Pt/family verbalizes understanding. Pt given signs and symptoms of infection. All questions answered. Payton Emerald, RN

## 2016-10-15 MED FILL — Insulin Regular (Human) Inj 100 Unit/ML: INTRAMUSCULAR | Qty: 250 | Status: AC

## 2016-10-16 ENCOUNTER — Telehealth (HOSPITAL_COMMUNITY): Payer: Self-pay | Admitting: *Deleted

## 2016-10-22 ENCOUNTER — Encounter: Payer: Self-pay | Admitting: Physician Assistant

## 2016-10-23 ENCOUNTER — Encounter (HOSPITAL_COMMUNITY): Payer: BLUE CROSS/BLUE SHIELD

## 2016-10-24 ENCOUNTER — Other Ambulatory Visit: Payer: Self-pay | Admitting: Internal Medicine

## 2016-10-24 ENCOUNTER — Telehealth: Payer: Self-pay | Admitting: Internal Medicine

## 2016-10-24 NOTE — Progress Notes (Unsigned)
REfer to  Cardiac rehab

## 2016-10-24 NOTE — Telephone Encounter (Signed)
Walk in pt Form-Online Disability Medical Statement-dropped off Gave to [employer]

## 2016-10-28 DIAGNOSIS — I739 Peripheral vascular disease, unspecified: Secondary | ICD-10-CM

## 2016-10-28 DIAGNOSIS — I779 Disorder of arteries and arterioles, unspecified: Secondary | ICD-10-CM | POA: Insufficient documentation

## 2016-10-28 NOTE — Progress Notes (Signed)
Cardiology Office Note:    Date:  10/29/2016   ID:  Joseph Jimenez, DOB 06-Jun-1956, MRN [id number]  PCP:  Suzi Roots, MD  Cardiologist:  Dr. Dorris Carnes   Electrophysiologist:  n/a  Referring MD: Suzi Roots, MD   Chief Complaint  Patient presents with  . Hospitalization Follow-up    s/p CABG    History of Present Illness:    Joseph Jimenez is a 59 y.o. male who was evaluated by Dr. Dorris Carnes in 11/17 for chest pain.  He was noted to have carotid bruits on exam.  Plain ETT and carotid US were arranged.  The Carotid US showed mild plaque with 1-39% bilateral ICA stenosis.  His ETT was abnormal with frequent ventricular ectopy and significant ST depression.  LHC was set up and demonstrated 3 V CAD.  He was referred for CABG.   He was admitted 11/14-11/19 and underwent CABG (L-LAD, S-Dx, L radial-OM, S-AM/LPDA) by Dr. Roxan Hockey.  His post op course was unremarkable.  He remained in [facility].  He returns for post hospitalization follow up.    He is here today with his wife. He is doing very well since DC from the hospital. He denies significant dyspnea. He denies significant discomfort in his chest. He denies orthopnea, PND or edema. His appetite is good. He denies any fevers.  He is eager to increase his activity. He has already contacted cardiac rehabilitation.  Prior CV studies that were reviewed today include:    Carotid US 10/09/16 Bilateral ICA 1-39   LHC 10/08/16 LAD proximal 95, mid 50, D1 ostial 90, D2 ostial 80 LCx proximal 40, mid 85, OM1 90 RCA proximal 90, mid 80 EF 55-65  ETT 10/08/16 Blood pressure demonstrated a normal response to exercise. Upsloping ST segment depression ST segment depression was noted during stress in the V5 and V6 leads, beginning at 6 minutes of stress, and returning to baseline after 1-5 minutes of recovery. Clinically negative, electrically positive for ischemia at low HR Pt with signif arrhythmia (PVCs, NSVT) that increased with  exercise Test stopped due to arrhythmias  Past Medical History:  Diagnosis Date  . Carotid artery disease ([place])    Carotid US 11/17:Bilateral ICA 1-39  . Coronary artery disease    a. LHC 11/17 with 3 vessel CAD, EF 55-65% // b. Status post CABG with Dr. Roxan Hockey 11/17 L-LAD, S-Dx, L radial-OM, S-AM/LPDA  . Heart murmur    "I've had it my whole life" (10/08/2016)  . High cholesterol   . History of kidney stones    "passed it" (10/08/2016)  . Prostate cancer ([place]) 2005    Past Surgical History:  Procedure Laterality Date  . CARDIAC CATHETERIZATION  10/08/2016  . CARDIAC CATHETERIZATION N/A 10/08/2016   Procedure: Left Heart Cath and Coronary Angiography;  Surgeon: Belva Crome, MD;  Location: [facility];  Service: Cardiovascular;  Laterality: N/A;  . COLONOSCOPY WITH PROPOFOL N/A 01/29/2016   Procedure: COLONOSCOPY WITH PROPOFOL;  Surgeon: Garlan Fair, MD;  Location: [facility];  Service: Endoscopy;  Laterality: N/A;  . CORONARY ARTERY BYPASS GRAFT N/A 10/10/2016   Procedure: CORONARY ARTERY BYPASS GRAFTING (CABG);  Surgeon: Melrose Nakayama, MD;  Location: [facility];  Service: Open Heart Surgery;  Laterality: N/A;  Time 5  using left internal mammary artery, endoscopically harvested right saphenous vein, and left radial artery.  Joseph Jimenez N/A 10/10/2016   Procedure: CYSTOSCOPY;  Surgeon: Melrose Nakayama, MD;  Location: [facility];  Service: Open  Heart Surgery;  Laterality: N/A;  . INGUINAL HERNIA REPAIR Bilateral 1958   "before I was 110 months old ="  . PROSTATE BIOPSY  2005  . PROSTATECTOMY  2005  . TEE WITHOUT CARDIOVERSION N/A 10/10/2016   Procedure: TRANSESOPHAGEAL ECHOCARDIOGRAM (TEE);  Surgeon: Melrose Nakayama, MD;  Location: [facility];  Service: Open Heart Surgery;  Laterality: N/A;    Current Medications: Current Meds  Medication Sig  . aspirin EC 325 MG EC tablet Take 1 tablet (325 mg total) by mouth daily.  Marland Kitchen atorvastatin (LIPITOR) 80 MG tablet  Take 1 tablet (80 mg total) by mouth daily at 6 PM.  . isosorbide mononitrate (IMDUR) 30 MG 24 hr tablet Take 0.5 tablets (15 mg total) by mouth daily. For 30 days  . metoprolol succinate (TOPROL-XL) 25 MG 24 hr tablet Take 1 tablet (25 mg total) by mouth daily.  . nitroGLYCERIN (NITROSTAT) 0.4 MG SL tablet Place 1 tablet (0.4 mg total) under the tongue every 5 (five) minutes as needed.  Marland Kitchen oxyCODONE (OXY IR/ROXICODONE) 5 MG immediate release tablet Take 1-2 tablets (5-10 mg total) by mouth every 4 (four) hours as needed for severe pain.     Allergies:   Patient has no known allergies.   Social History   Social History  . Marital status: Married    Spouse name: N/A  . Number of children: N/A  . Years of education: N/A   Social History Main Topics  . Smoking status: Former Smoker    Packs/day: 1.00    Years: 20.00    Types: Cigarettes    Quit date: 09/15/1998  . Smokeless tobacco: Never Used  . Alcohol use Yes     Comment: "quit drinking 09/15/1998"  . Drug use:      Comment: 10/08/2016 "experimented in college; none since"  . Sexual activity: Yes   Other Topics Concern  . None   Social History Narrative  . None     Family History:  The patient's family history includes CAD in his mother; Obesity in his brother.   ROS:   Please see the history of present illness.    ROS All other systems reviewed and are negative.   EKGs/Labs/Other Test Reviewed:    EKG:  EKG is  ordered today.  The ekg ordered today demonstrates  NSR, HR 69, normal axis, T-wave inversion in 1, aVL, QTc 407 ms  Recent Labs: 10/11/2016: Magnesium 2.3 10/13/2016: BUN 11; Creatinine, Ser 0.87; Hemoglobin 11.3; Platelets 151; Potassium 3.8; Sodium 138   Recent Lipid Panel    Component Value Date/Time   CHOL 244 (H) 10/07/2016 0816   TRIG 106 10/07/2016 0816   HDL 57 10/07/2016 0816   CHOLHDL 4.3 10/07/2016 0816   VLDL 21 10/07/2016 0816   LDLCALC 166 (H) 10/07/2016 0816     Physical Exam:     VS:  BP 130/70   Pulse 69   Ht 5\' 11"  (1.803 m)   Wt 202 lb (91.6 kg)   BMI 28.17 kg/m     Wt Readings from Last 3 Encounters:  10/29/16 202 lb (91.6 kg)  10/13/16 213 lb 12.8 oz (97 kg)  10/04/16 212 lb (96.2 kg)     Physical Exam  Constitutional: He is oriented to person, place, and time. He appears well-developed and well-nourished. No distress.  HENT:  Head: Normocephalic and atraumatic.  Eyes: No scleral icterus.  Neck: No JVD present.  Cardiovascular: Normal rate, regular rhythm and normal heart sounds.   No  murmur heard. Pulmonary/Chest: Effort normal. He has no wheezes. He has no rales.  Median sternotomy well healed without erythema or discharge  Abdominal: Soft. There is no tenderness.  Musculoskeletal: He exhibits no edema.  Neurological: He is alert and oriented to person, place, and time.  Skin: Skin is warm and dry.  Psychiatric: He has a normal mood and affect.    ASSESSMENT:    1. Coronary artery disease involving native coronary artery of native heart without angina pectoris   2. Other hyperlipidemia   3. Bilateral carotid artery disease ([place])    PLAN:    In order of problems listed above:  1. CAD - s/p CABG.  He is doing very well since DC.  He is walking several minutes a day.  -  He will start cardiac rehab after he sees Dr. Roxan Hockey.  -  Continue ASA, statin, beta-blocker.   2. HL - Continue high dose statin. Arrange FU Lipids and LFTs.  3. Carotid artery disease - Mild plaque on recent dopplers.  Consider follow up Carotid ultrasound in 1-2 years.   Medication Adjustments/Labs and Tests Ordered: Current medicines are reviewed at length with the patient today.  Concerns regarding medicines are outlined above.  Medication changes, Labs and Tests ordered today are outlined in the Patient Instructions noted below. Patient Instructions  Medication Instructions:  No changes.  See your medication list.  Labwork: In 2 months - Lipids, LFTs  12/30/16 ; FASTING   Testing/Procedures: None   Follow-Up: Dr. Dorris Carnes on 01/31/17 @ 8 AM  Any Other Special Instructions Will Be Listed Below (If Applicable).  If you need a refill on your cardiac medications before your next appointment, please call your pharmacy.   Signed, Richardson Dopp, PA-C  10/29/2016 10:12 AM    [facility] [address], [place], [place]  [address] Phone: [phone]; Fax: [phone]

## 2016-10-29 ENCOUNTER — Ambulatory Visit (INDEPENDENT_AMBULATORY_CARE_PROVIDER_SITE_OTHER): Payer: BLUE CROSS/BLUE SHIELD | Admitting: Physician Assistant

## 2016-10-29 ENCOUNTER — Encounter: Payer: Self-pay | Admitting: Physician Assistant

## 2016-10-29 VITALS — BP 130/70 | HR 69 | Ht 71.0 in | Wt 202.0 lb

## 2016-10-29 DIAGNOSIS — I739 Peripheral vascular disease, unspecified: Secondary | ICD-10-CM

## 2016-10-29 DIAGNOSIS — E784 Other hyperlipidemia: Secondary | ICD-10-CM | POA: Diagnosis not present

## 2016-10-29 DIAGNOSIS — E7849 Other hyperlipidemia: Secondary | ICD-10-CM

## 2016-10-29 DIAGNOSIS — I2510 Atherosclerotic heart disease of native coronary artery without angina pectoris: Secondary | ICD-10-CM | POA: Diagnosis not present

## 2016-10-29 DIAGNOSIS — I779 Disorder of arteries and arterioles, unspecified: Secondary | ICD-10-CM | POA: Diagnosis not present

## 2016-10-29 NOTE — Patient Instructions (Addendum)
Medication Instructions:  No changes.  See your medication list.  Labwork: In 2 months - Lipids, LFTs 12/30/16 ; FASTING   Testing/Procedures: None   Follow-Up: Dr. Dorris Carnes on 01/31/17 @ 8 AM  Any Other Special Instructions Will Be Listed Below (If Applicable).  If you need a refill on your cardiac medications before your next appointment, please call your pharmacy.

## 2016-10-31 ENCOUNTER — Telehealth: Payer: Self-pay | Admitting: Internal Medicine

## 2016-10-31 NOTE — Telephone Encounter (Signed)
10-30-16 [place] [phone], Per Chloe H, no precert required for CPT 93798, Outpt cardiac rehab - based on medical necessity.  No co pay, deduct $1,000 met, pays 80% of allowable. $2,600 out of pocket met.

## 2016-11-08 ENCOUNTER — Other Ambulatory Visit: Payer: Self-pay | Admitting: Thoracic Surgery (Cardiothoracic Vascular Surgery)

## 2016-11-08 DIAGNOSIS — Z951 Presence of aortocoronary bypass graft: Secondary | ICD-10-CM

## 2016-11-11 ENCOUNTER — Ambulatory Visit (INDEPENDENT_AMBULATORY_CARE_PROVIDER_SITE_OTHER): Payer: Self-pay | Admitting: Physician Assistant

## 2016-11-11 ENCOUNTER — Ambulatory Visit
Admission: RE | Admit: 2016-11-11 | Discharge: 2016-11-11 | Disposition: A | Discharge status: Home / Self Care | Payer: BLUE CROSS/BLUE SHIELD | Source: Ambulatory Visit | Attending: Thoracic Surgery (Cardiothoracic Vascular Surgery) | Admitting: Thoracic Surgery (Cardiothoracic Vascular Surgery)

## 2016-11-11 VITALS — BP 128/75 | HR 61 | Resp 16 | Ht 71.0 in | Wt 202.0 lb

## 2016-11-11 DIAGNOSIS — Z951 Presence of aortocoronary bypass graft: Secondary | ICD-10-CM

## 2016-11-11 DIAGNOSIS — I2510 Atherosclerotic heart disease of native coronary artery without angina pectoris: Secondary | ICD-10-CM

## 2016-11-11 DIAGNOSIS — I517 Cardiomegaly: Secondary | ICD-10-CM | POA: Diagnosis not present

## 2016-11-11 NOTE — Progress Notes (Signed)
HPI:  Patient returns for routine postoperative follow-up having undergone CABG x 5 on 10/10/2016.  The patient's early postoperative recovery while in the hospital was unremarkable and he was discharged home on POD #3.  Since hospital discharge the patient reports he is doing very well.  He is hoping to be able to resume some of his normal activities.  He is walking in 25 min increments and is hoping to do more.  He is scheduled to start cardiac rehab tomorrow.  He states his incisions are doing well, except he does have a stitch coming out of both his arm and his leg incision.  He is anxious to get back to work.   Current Outpatient Prescriptions  Medication Sig Dispense Refill  . aspirin EC 325 MG EC tablet Take 1 tablet (325 mg total) by mouth daily. 30 tablet 0  . atorvastatin (LIPITOR) 80 MG tablet Take 1 tablet (80 mg total) by mouth daily at 6 PM. 30 tablet 3  . metoprolol succinate (TOPROL-XL) 25 MG 24 hr tablet Take 1 tablet (25 mg total) by mouth daily. 30 tablet 3  . nitroGLYCERIN (NITROSTAT) 0.4 MG SL tablet Place 1 tablet (0.4 mg total) under the tongue every 5 (five) minutes as needed. 25 tablet 3   No current facility-administered medications for this visit.     Physical Exam   Gen: no apparent distress, looks great Heart: RRR Lungs: CTA bilaterally Abd: soft non-tender, non-distended Ext: no edema present Incisions: well healed, stitch present in LUE incision and Right EVH site  Diagnostic Tests:  CXR: sternal wires are intact, no pleural effusion present  A/P;  1. S/P CABG x 5 doing very well, remains hemodynamically stable 2. May resume driving, activity as tolerated, continues to have weight restriction of 10-15 lbs over the next several weeks 3. May return to work on 01/06/2016 if no further issues arise 4. Dispo- patient doing very well, RTC prn  Analena Gama, PA-C [facility] [phone]

## 2016-11-11 NOTE — Patient Instructions (Signed)
You may return to driving an automobile as long as you are no longer requiring oral narcotic pain relievers during the daytime.  It would be wise to start driving only short distances during the daylight and gradually increase from there as you feel comfortable.   You are encouraged to enroll and participate in the outpatient cardiac rehab program beginning as soon as practical.

## 2016-11-12 ENCOUNTER — Encounter (HOSPITAL_COMMUNITY): Payer: Self-pay

## 2016-11-12 ENCOUNTER — Encounter (HOSPITAL_COMMUNITY)
Admission: RE | Admit: 2016-11-12 | Discharge: 2016-11-12 | Disposition: A | Discharge status: Home / Self Care | Payer: BLUE CROSS/BLUE SHIELD | Source: Ambulatory Visit | Attending: Internal Medicine | Admitting: Internal Medicine

## 2016-11-12 DIAGNOSIS — Z951 Presence of aortocoronary bypass graft: Secondary | ICD-10-CM | POA: Insufficient documentation

## 2016-11-12 NOTE — Progress Notes (Signed)
Mr Joseph Jimenez came to orientation for cardiac rehab and was told by [employer] that he  needs predetermination authorized  Before participating in phase 2 cardiac rehab. Mr Joseph Jimenez did not have a walk test today. Will notify Dr Alan Ripper office and reschedule when authorized.Harrell Gave RN BSN

## 2016-11-12 NOTE — Progress Notes (Signed)
Cardiac Rehab Medication Review by a Pharmacist  Does the patient  feel that his/her medications are working for him/her?  yes  Has the patient been experiencing any side effects to the medications prescribed?  no  Does the patient measure his/her own blood pressure or blood glucose at home?  no   Does the patient have any problems obtaining medications due to transportation or finances?   no  Understanding of regimen: good Understanding of indications: good Potential of compliance: good    Pharmacist comments: Pt presents for initial cardiac rehab appointment. Pt denies measuring BP at this time - encouraged pt to obtain a BP cuff and monitor BP regularly. Pt endorses no issues obtaining medicines. No other issues noted.   Arrie Senate, PharmD PGY-1 Pharmacy Resident Pager: [phone] 11/12/2016

## 2016-11-13 ENCOUNTER — Telehealth: Payer: Self-pay | Admitting: Internal Medicine

## 2016-11-13 ENCOUNTER — Telehealth (HOSPITAL_COMMUNITY): Payer: Self-pay | Admitting: *Deleted

## 2016-11-13 NOTE — Telephone Encounter (Signed)
Spoke w/Christina E, [place], [phone], Call [phone].  Pt was mis quoted by insurance customer service re: predetermination requirement.  Clarification made that pt does not require prior authorization or predetermination for outpt, cardiac rehab.  Predetermination can be optionally submitted prior to or w/claim.  Pt must complete rehab w/i 6 months of qualifying event and start w/i 90 days of qualifying event.  Pt may have up to 3 rehab visits per week for up to 12 weeks and no more than 36 visits within allowed 6 month timeframe.

## 2016-11-20 ENCOUNTER — Telehealth: Payer: Self-pay | Admitting: Internal Medicine

## 2016-11-20 ENCOUNTER — Ambulatory Visit (INDEPENDENT_AMBULATORY_CARE_PROVIDER_SITE_OTHER): Payer: Self-pay | Admitting: Surgical

## 2016-11-20 ENCOUNTER — Encounter (HOSPITAL_COMMUNITY)
Admission: RE | Admit: 2016-11-20 | Discharge: 2016-11-20 | Disposition: A | Discharge status: Home / Self Care | Payer: BLUE CROSS/BLUE SHIELD | Source: Ambulatory Visit | Attending: Internal Medicine | Admitting: Internal Medicine

## 2016-11-20 VITALS — BP 110/60 | HR 67 | Ht 70.5 in | Wt 206.1 lb

## 2016-11-20 VITALS — BP 116/74 | HR 59 | Resp 16 | Ht 71.0 in | Wt 200.0 lb

## 2016-11-20 DIAGNOSIS — I2510 Atherosclerotic heart disease of native coronary artery without angina pectoris: Secondary | ICD-10-CM

## 2016-11-20 DIAGNOSIS — Z951 Presence of aortocoronary bypass graft: Secondary | ICD-10-CM

## 2016-11-20 NOTE — Progress Notes (Signed)
Cardiac Individual Treatment Plan  Patient Details  Name: Joseph Jimenez MRN: [id number] Date of Birth: 11/25/1956 Referring Provider:   Flowsheet Row CARDIAC REHAB PHASE II EXERCISE from 11/20/2016 in [place]  Referring Provider  Dorris Carnes MD      Initial Encounter Date:  [place] PHASE II EXERCISE from 11/20/2016 in [place]  Date  11/20/16  Referring Provider  Dorris Carnes MD      Visit Diagnosis: S/P CABG x 5  Patient's Home Medications on Admission:  Current Outpatient Prescriptions:  .  aspirin EC 325 MG EC tablet, Take 1 tablet (325 mg total) by mouth daily., Disp: 30 tablet, Rfl: 0 .  atorvastatin (LIPITOR) 80 MG tablet, Take 1 tablet (80 mg total) by mouth daily at 6 PM., Disp: 30 tablet, Rfl: 3 .  metoprolol succinate (TOPROL-XL) 25 MG 24 hr tablet, Take 1 tablet (25 mg total) by mouth daily., Disp: 30 tablet, Rfl: 3 .  nitroGLYCERIN (NITROSTAT) 0.4 MG SL tablet, Place 1 tablet (0.4 mg total) under the tongue every 5 (five) minutes as needed., Disp: 25 tablet, Rfl: 3  Past Medical History: Past Medical History:  Diagnosis Date  . Carotid artery disease ([place])    Carotid US 11/17:Bilateral ICA 1-39  . Coronary artery disease    a. LHC 11/17 with 3 vessel CAD, EF 55-65% // b. Status post CABG with Dr. Roxan Hockey 11/17 L-LAD, S-Dx, L radial-OM, S-AM/LPDA  . Heart murmur    "I've had it my whole life" (10/08/2016)  . High cholesterol   . History of kidney stones    "passed it" (10/08/2016)  . Prostate cancer ([place]) 2005    Tobacco Use: History  Smoking Status  . Former Smoker  . Packs/day: 1.00  . Years: 20.00  . Types: Cigarettes  . Quit date: 09/15/1998  Smokeless Tobacco  . Never Used    Labs: Recent Review Flowsheet Data    Labs for ITP Cardiac and Pulmonary Rehab Latest Ref Rng & Units 10/10/2016 10/10/2016 10/10/2016 10/10/2016 10/11/2016   Cholestrol <200  mg/dL - - - - -   LDLCALC <100 mg/dL - - - - -   HDL >40 mg/dL - - - - -   Trlycerides <150 mg/dL - - - - -   Hemoglobin A1c 4.8 - 5.6 % - - - - -   PHART 7.350 - 7.450 7.338(L) 7.328(L) 7.388 - -   PCO2ART 32.0 - 48.0 mmHg 47.2 43.2 39.7 - -   HCO3 20.0 - 28.0 mmol/L 25.5 22.9 23.9 - -   TCO2 0 - 100 mmol/L 27 24 25 24 23    ACIDBASEDEF 0.0 - 2.0 mmol/L 1.0 3.0(H) 1.0 - -   O2SAT % 96.0 97.0 98.0 - -      Capillary Blood Glucose: Lab Results  Component Value Date   GLUCAP 125 (H) 10/12/2016   GLUCAP 121 (H) 10/12/2016   GLUCAP 115 (H) 10/11/2016   GLUCAP 118 (H) 10/11/2016   GLUCAP 125 (H) 10/11/2016     Exercise Target Goals: Date: 11/20/16  Exercise Program Goal: Individual exercise prescription set with THRR, safety & activity barriers. Participant demonstrates ability to understand and report RPE using BORG scale, to self-measure pulse accurately, and to acknowledge the importance of the exercise prescription.  Exercise Prescription Goal: Starting with aerobic activity 30 plus minutes a day, 3 days per week for initial exercise prescription. Provide home exercise prescription and guidelines that participant  acknowledges understanding prior to discharge.  Activity Barriers & Risk Stratification:     Activity Barriers & Cardiac Risk Stratification - 11/12/16 1419      Activity Barriers & Cardiac Risk Stratification   Activity Barriers None   Cardiac Risk Stratification High      6 Minute Walk:     6 Minute Walk    Row Name 11/20/16 1127         6 Minute Walk   Phase Initial     Distance 1834 feet     Walk Time 6 minutes     # of Rest Breaks 0     MPH 3.5     METS 4.5     RPE 7     VO2 Peak 15.7     Symptoms No     Resting HR 67 bpm     Resting BP 110/60     Max Ex. HR 95 bpm     Max Ex. BP 154/84     2 Minute Post BP 120/72        Initial Exercise Prescription:     Initial Exercise Prescription - 11/20/16 1100      Date of Initial Exercise  RX and Referring Provider   Date 11/20/16   Referring Provider Dorris Carnes MD     Treadmill   MPH 3   Grade 1   Minutes 10   METs 3.71     Bike   Level 1   Minutes 10   METs 3.7     NuStep   Level 3   Minutes 10   METs 3     Prescription Details   Frequency (times per week) 3   Duration Progress to 45 minutes of aerobic exercise without signs/symptoms of physical distress     Intensity   THRR 40-80% of Max Heartrate 64-129   Ratings of Perceived Exertion 11-13   Perceived Dyspnea 0-4     Progression   Progression Continue to progress workloads to maintain intensity without signs/symptoms of physical distress.     Resistance Training   Training Prescription Yes   Weight 3   Reps 10-12      Perform Capillary Blood Glucose checks as needed.  Exercise Prescription Changes:   Exercise Comments:   Discharge Exercise Prescription (Final Exercise Prescription Changes):   Nutrition:  Target Goals: Understanding of nutrition guidelines, daily intake of sodium 1500mg , cholesterol 200mg , calories 30% from fat and 7% or less from saturated fats, daily to have 5 or more servings of fruits and vegetables.  Biometrics:     Pre Biometrics - 11/20/16 1129      Pre Biometrics   Height 5' 10.5" (1.791 m)   Weight 206 lb 2.1 oz (93.5 kg)   Waist Circumference 41.75 inches   Hip Circumference 42.5 inches   Waist to Hip Ratio 0.98 %   BMI (Calculated) 29.2   Triceps Skinfold 12 mm   % Body Fat 27.4 %   Grip Strength 53.5 kg   Flexibility 16 in       Nutrition Therapy Plan and Nutrition Goals:   Nutrition Discharge: Nutrition Scores:   Nutrition Goals Re-Evaluation:   Psychosocial: Target Goals: Acknowledge presence or absence of depression, maximize coping skills, provide positive support system. Participant is able to verbalize types and ability to use techniques and skills needed for reducing stress and depression.  Initial Review & Psychosocial  Screening:     Initial Psych Review & Screening - 11/12/16  [place]? Yes   Comments --  brief psychosocial assesment reveals no further intervention needed  at this time.     Barriers   Psychosocial barriers to participate in program There are no identifiable barriers or psychosocial needs.     Screening Interventions   Interventions Encouraged to exercise      Quality of Life Scores:     Quality of Life - 11/12/16 1628      Quality of Life Scores   Health/Function Pre 22.13 %   Socioeconomic Pre 15.23 %   Psych/Spiritual Pre 30 %   Family Pre 28.29 %   GLOBAL Pre 20.4 %      PHQ-9: Recent Review Flowsheet Data    Depression screen [facility] 2/9 11/20/2016   Decreased Interest 0   Down, Depressed, Hopeless 0   PHQ - 2 Score 0      Psychosocial Evaluation and Intervention:   Psychosocial Re-Evaluation:   Vocational Rehabilitation: Provide vocational rehab assistance to qualifying candidates.   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 Participant will state understanding/return demonstration of topics presented.  Learning Barriers/Preferences:     Learning Barriers/Preferences - 11/12/16 1350      Learning Barriers/Preferences   Learning Barriers Sight   Learning Preferences Video;Pictoral;Skilled Demonstration;Written Material;Computer/Internet      Education Topics: Count Your Pulse:  -Group instruction provided by verbal instruction, demonstration, patient participation and written materials to support subject.  Instructors address importance of being able  to find your pulse and how to count your pulse when at home without a heart monitor.  Patients get hands on experience counting their pulse with staff help and individually.   Heart Attack, Angina, and Risk Factor Modification:  -Group instruction provided by verbal instruction, video, and written materials to support subject.  Instructors address signs and symptoms of angina and heart attacks.    Also discuss risk factors for heart disease and how to make changes to improve heart health risk factors.   Functional Fitness:  -Group instruction provided by verbal instruction, demonstration, patient participation, and written materials to support subject.  Instructors address safety measures for doing things around the house.  Discuss how to get up and down off the floor, how to pick things up properly, how to safely get out of a chair without assistance, and balance training.   Meditation and Mindfulness:  -Group instruction provided by verbal instruction, patient participation, and written materials to support subject.  Instructor addresses importance of mindfulness and meditation practice to help reduce stress and improve awareness.  Instructor also leads participants through a meditation exercise.    Stretching for Flexibility and Mobility:  -Group instruction provided by verbal instruction, patient participation, and written materials to support subject.  Instructors lead participants through series of stretches that are designed to increase flexibility thus improving mobility.  These stretches are additional exercise for major muscle groups that are typically performed during regular warm up and cool down.   Hands Only CPR Anytime:  -Group instruction provided by verbal instruction, video, patient participation and written  materials to support subject.  Instructors co-teach with AHA video for hands only CPR.  Participants get hands on experience with mannequins.   Nutrition I class: Heart  Healthy Eating:  -Group instruction provided by PowerPoint slides, verbal discussion, and written materials to support subject matter. The instructor gives an explanation and review of the Therapeutic Lifestyle Changes diet recommendations, which includes a discussion on lipid goals, dietary fat, sodium, fiber, plant stanol/sterol esters, sugar, and the components of a well-balanced, healthy diet.   Nutrition II class: Lifestyle Skills:  -Group instruction provided by PowerPoint slides, verbal discussion, and written materials to support subject matter. The instructor gives an explanation and review of label reading, grocery shopping for heart health, heart healthy recipe modifications, and ways to make healthier choices when eating out.   Diabetes Question & Answer:  -Group instruction provided by PowerPoint slides, verbal discussion, and written materials to support subject matter. The instructor gives an explanation and review of diabetes co-morbidities, pre- and post-prandial blood glucose goals, pre-exercise blood glucose goals, signs, symptoms, and treatment of hypoglycemia and hyperglycemia, and foot care basics.   Diabetes Blitz:  -Group instruction provided by PowerPoint slides, verbal discussion, and written materials to support subject matter. The instructor gives an explanation and review of the physiology behind type 1 and type 2 diabetes, diabetes medications and rational behind using different medications, pre- and post-prandial blood glucose recommendations and Hemoglobin A1c goals, diabetes diet, and exercise including blood glucose guidelines for exercising safely.    Portion Distortion:  -Group instruction provided by PowerPoint slides, verbal discussion, written materials, and food models to support subject matter. The instructor gives an explanation of serving size versus portion size, changes in portions sizes over the last 20 years, and what consists of a serving from each  food group.   Stress Management:  -Group instruction provided by verbal instruction, video, and written materials to support subject matter.  Instructors review role of stress in heart disease and how to cope with stress positively.     Exercising on Your Own:  -Group instruction provided by verbal instruction, power point, and written materials to support subject.  Instructors discuss benefits of exercise, components of exercise, frequency and intensity of exercise, and end points for exercise.  Also discuss use of nitroglycerin and activating EMS.  Review options of places to exercise outside of rehab.  Review guidelines for sex with heart disease.   Cardiac Drugs I:  -Group instruction provided by verbal instruction and written materials to support subject.  Instructor reviews cardiac drug classes: antiplatelets, anticoagulants, beta blockers, and statins.  Instructor discusses reasons, side effects, and lifestyle considerations for each drug class.   Cardiac Drugs II:  -Group instruction provided by verbal instruction and written materials to support subject.  Instructor reviews cardiac drug classes: angiotensin converting enzyme inhibitors (ACE-I), angiotensin II receptor blockers (ARBs), nitrates, and calcium channel blockers.  Instructor discusses reasons, side effects, and lifestyle considerations for each drug class.   Anatomy and Physiology of the Circulatory System:  -Group instruction provided by verbal instruction, video, and written materials to support subject.  Reviews functional anatomy of heart, how it relates to various diagnoses, and what role the heart plays in the overall system.   Knowledge Questionnaire Score:     Knowledge Questionnaire Score - 11/12/16 1401      Knowledge Questionnaire Score   Pre Score 21/24      Core Components/Risk Factors/Patient Goals at Admission:     Personal Goals and Risk  Factors at Admission - 11/12/16 1347      Core  Components/Risk Factors/Patient Goals on Admission   Lipids Yes   Intervention Provide education and support for participant on nutrition & aerobic/resistive exercise along with prescribed medications to achieve LDL 70mg , HDL >40mg .   Expected Outcomes Short Term: Participant states understanding of desired cholesterol values and is compliant with medications prescribed. Participant is following exercise prescription and nutrition guidelines.;Long Term: Cholesterol controlled with medications as prescribed, with individualized exercise RX and with personalized nutrition plan. Value goals: LDL < 70mg , HDL > 40 mg.   Stress Yes   Intervention Offer individual and/or small group education and counseling on adjustment to heart disease, stress management and health-related lifestyle change. Teach and support self-help strategies.;Refer participants experiencing significant psychosocial distress to appropriate mental health specialists for further evaluation and treatment. When possible, include family members and significant others in education/counseling sessions.   Expected Outcomes Short Term: Participant demonstrates changes in health-related behavior, relaxation and other stress management skills, ability to obtain effective social support, and compliance with psychotropic medications if prescribed.;Long Term: Emotional wellbeing is indicated by absence of clinically significant psychosocial distress or social isolation.      Core Components/Risk Factors/Patient Goals Review:    Core Components/Risk Factors/Patient Goals at Discharge (Final Review):    ITP Comments:     ITP Comments    Row Name 11/12/16 1333           ITP Comments Medical Director- Dr. Fransico Him, MD.          Comments: Joseph Jimenez attended orientation on last Thursday and completed walk test today from 0945 to 1030 to review rules and guidelines for program. Completed 6 minute walk test, Intitial ITP, and exercise  prescription.  VSS. Telemetry-Sinus Rhyhtm.  Asymptomatic.Joseph Jimenez was noted to have a 1/2 dime sized area on his left radial harvest site which is tender upon palpation. Joseph Jimenez says he thinks his kitten may have scratched the area.  Dr Hedrickson's office called and notified spoke with Manuela Schwartz. Manuela Schwartz set up an appointment for Joseph Jimenez to have his incision evaluated today at 1 pm. Harrell Gave RN BSN

## 2016-11-20 NOTE — Telephone Encounter (Signed)
Walk In pt Form-State [place] online filing for disability papers dropped off in office-I will hold onto these papers Michalene not back in office until 11/27/16

## 2016-11-20 NOTE — Progress Notes (Signed)
      [address]       [place] [address]             [phone]     The patient was seen in the office on today's date to recheck his [place] site incision as well as his left arm radial incision. He had 2 tiny areas where there was a small amount of suture material poking through the skin. I removed these without difficulty. There was no evidence of cellulitis or deep infection. He feels quite well without fevers, chills or other constitutional symptoms. We will see again on a when necessary basis should he have any further difficulty with the incisions.   Thurl Boen E, PA-C

## 2016-11-21 NOTE — Progress Notes (Signed)
Please confirm that pt has f/u set in cardiology  I did not see that  Should be in 2 to 3 months

## 2016-11-22 ENCOUNTER — Encounter (HOSPITAL_COMMUNITY)
Admission: RE | Admit: 2016-11-22 | Discharge: 2016-11-22 | Disposition: A | Discharge status: Home / Self Care | Payer: BLUE CROSS/BLUE SHIELD | Source: Ambulatory Visit | Attending: Internal Medicine | Admitting: Internal Medicine

## 2016-11-22 DIAGNOSIS — Z951 Presence of aortocoronary bypass graft: Secondary | ICD-10-CM

## 2016-11-27 ENCOUNTER — Encounter (HOSPITAL_COMMUNITY)
Admission: RE | Admit: 2016-11-27 | Discharge: 2016-11-27 | Disposition: A | Discharge status: Home / Self Care | Payer: BLUE CROSS/BLUE SHIELD | Source: Ambulatory Visit | Attending: Internal Medicine | Admitting: Internal Medicine

## 2016-11-27 DIAGNOSIS — Z951 Presence of aortocoronary bypass graft: Secondary | ICD-10-CM | POA: Insufficient documentation

## 2016-11-28 ENCOUNTER — Telehealth: Payer: Self-pay | Admitting: *Deleted

## 2016-11-28 NOTE — Telephone Encounter (Signed)
Walk in patient form dropped off on 11/20/17.  Notice of ineligible determinations---from [place] [employer] of Electrical [employer].  Notice states patient is ineligible for benefits because the medical certification requested was not provided. Online filing instructions are included and must be submitted online.  [email or web address] (case sensitive) ONLINE FORM ID: [id number] DATE DISABILITY BEGAN: 10/08/16   Will rout to Dr. Harrington Challenger to inform.

## 2016-11-29 ENCOUNTER — Ambulatory Visit (INDEPENDENT_AMBULATORY_CARE_PROVIDER_SITE_OTHER): Payer: Self-pay

## 2016-11-29 ENCOUNTER — Encounter (HOSPITAL_COMMUNITY)
Admission: RE | Admit: 2016-11-29 | Discharge: 2016-11-29 | Disposition: A | Discharge status: Home / Self Care | Payer: BLUE CROSS/BLUE SHIELD | Source: Ambulatory Visit | Attending: Internal Medicine | Admitting: Internal Medicine

## 2016-11-29 DIAGNOSIS — IMO0001 Reserved for inherently not codable concepts without codable children: Secondary | ICD-10-CM

## 2016-11-29 DIAGNOSIS — Z4889 Encounter for other specified surgical aftercare: Secondary | ICD-10-CM

## 2016-11-29 DIAGNOSIS — T814XXA Infection following a procedure, initial encounter: Secondary | ICD-10-CM

## 2016-11-29 DIAGNOSIS — Z951 Presence of aortocoronary bypass graft: Secondary | ICD-10-CM

## 2016-11-29 DIAGNOSIS — I2510 Atherosclerotic heart disease of native coronary artery without angina pectoris: Secondary | ICD-10-CM

## 2016-11-29 NOTE — Telephone Encounter (Signed)
All Notes   Progress Notes by Fay Records, MD at 11/20/2016 1:00 PM   Author: Fay Records, MD Author Type: Physician Filed: 11/21/2016 10:35 AM  Note Status: Signed Cosign: Cosign Not Required Encounter Date: 11/20/2016 1:00 PM  Editor: Fay Records, MD (Physician)    Please confirm that pt has f/u set in cardiology  I did not see that  Should be in 2 to 3 months        PT IS Joseph Jimenez DR. ROSS ON 02/01/16

## 2016-11-29 NOTE — Progress Notes (Signed)
Cardiac Individual Treatment Plan  Patient Details  Name: Joseph Jimenez MRN: [id number] Date of Birth: 1956/08/21 Referring Provider:   Flowsheet Row CARDIAC REHAB PHASE II EXERCISE from 11/20/2016 in [place]  Referring Provider  Dorris Carnes MD      Initial Encounter Date:  [place] PHASE II EXERCISE from 11/20/2016 in [place]  Date  11/20/16  Referring Provider  Dorris Carnes MD      Visit Diagnosis: S/P CABG x 5  Patient's Home Medications on Admission:  Current Outpatient Prescriptions:  .  aspirin EC 325 MG EC tablet, Take 1 tablet (325 mg total) by mouth daily., Disp: 30 tablet, Rfl: 0 .  atorvastatin (LIPITOR) 80 MG tablet, Take 1 tablet (80 mg total) by mouth daily at 6 PM., Disp: 30 tablet, Rfl: 3 .  metoprolol succinate (TOPROL-XL) 25 MG 24 hr tablet, Take 1 tablet (25 mg total) by mouth daily., Disp: 30 tablet, Rfl: 3 .  nitroGLYCERIN (NITROSTAT) 0.4 MG SL tablet, Place 1 tablet (0.4 mg total) under the tongue every 5 (five) minutes as needed., Disp: 25 tablet, Rfl: 3  Past Medical History: Past Medical History:  Diagnosis Date  . Carotid artery disease ([place])    Carotid US 11/17:Bilateral ICA 1-39  . Coronary artery disease    a. LHC 11/17 with 3 vessel CAD, EF 55-65% // b. Status post CABG with Dr. Roxan Hockey 11/17 L-LAD, S-Dx, L radial-OM, S-AM/LPDA  . Heart murmur    "I've had it my whole life" (10/08/2016)  . High cholesterol   . History of kidney stones    "passed it" (10/08/2016)  . Prostate cancer ([place]) 2005    Tobacco Use: History  Smoking Status  . Former Smoker  . Packs/day: 1.00  . Years: 20.00  . Types: Cigarettes  . Quit date: 09/15/1998  Smokeless Tobacco  . Never Used    Labs: Recent Review Flowsheet Data    Labs for ITP Cardiac and Pulmonary Rehab Latest Ref Rng & Units 10/10/2016 10/10/2016 10/10/2016 10/10/2016 10/11/2016   Cholestrol <200  mg/dL - - - - -   LDLCALC <100 mg/dL - - - - -   HDL >40 mg/dL - - - - -   Trlycerides <150 mg/dL - - - - -   Hemoglobin A1c 4.8 - 5.6 % - - - - -   PHART 7.350 - 7.450 7.338(L) 7.328(L) 7.388 - -   PCO2ART 32.0 - 48.0 mmHg 47.2 43.2 39.7 - -   HCO3 20.0 - 28.0 mmol/L 25.5 22.9 23.9 - -   TCO2 0 - 100 mmol/L 27 24 25 24 23    ACIDBASEDEF 0.0 - 2.0 mmol/L 1.0 3.0(H) 1.0 - -   O2SAT % 96.0 97.0 98.0 - -      Capillary Blood Glucose: Lab Results  Component Value Date   GLUCAP 125 (H) 10/12/2016   GLUCAP 121 (H) 10/12/2016   GLUCAP 115 (H) 10/11/2016   GLUCAP 118 (H) 10/11/2016   GLUCAP 125 (H) 10/11/2016     Exercise Target Goals:    Exercise Program Goal: Individual exercise prescription set with THRR, safety & activity barriers. Participant demonstrates ability to understand and report RPE using BORG scale, to self-measure pulse accurately, and to acknowledge the importance of the exercise prescription.  Exercise Prescription Goal: Starting with aerobic activity 30 plus minutes a day, 3 days per week for initial exercise prescription. Provide home exercise prescription and guidelines that participant  acknowledges understanding prior to discharge.  Activity Barriers & Risk Stratification:     Activity Barriers & Cardiac Risk Stratification - 11/12/16 1419      Activity Barriers & Cardiac Risk Stratification   Activity Barriers None   Cardiac Risk Stratification High      6 Minute Walk:     6 Minute Walk    Row Name 11/20/16 1127         6 Minute Walk   Phase Initial     Distance 1834 feet     Walk Time 6 minutes     # of Rest Breaks 0     MPH 3.5     METS 4.5     RPE 7     VO2 Peak 15.7     Symptoms No     Resting HR 67 bpm     Resting BP 110/60     Max Ex. HR 95 bpm     Max Ex. BP 154/84     2 Minute Post BP 120/72        Initial Exercise Prescription:     Initial Exercise Prescription - 11/20/16 1100      Date of Initial Exercise RX and  Referring Provider   Date 11/20/16   Referring Provider Dorris Carnes MD     Treadmill   MPH 3   Grade 1   Minutes 10   METs 3.71     Bike   Level 1   Minutes 10   METs 3.7     NuStep   Level 3   Minutes 10   METs 3     Prescription Details   Frequency (times per week) 3   Duration Progress to 45 minutes of aerobic exercise without signs/symptoms of physical distress     Intensity   THRR 40-80% of Max Heartrate 64-129   Ratings of Perceived Exertion 11-13   Perceived Dyspnea 0-4     Progression   Progression Continue to progress workloads to maintain intensity without signs/symptoms of physical distress.     Resistance Training   Training Prescription Yes   Weight 3   Reps 10-12      Perform Capillary Blood Glucose checks as needed.  Exercise Prescription Changes:     Exercise Prescription Changes    Row Name 11/27/16 1200             Response to Exercise   Blood Pressure (Admit) 128/70       Blood Pressure (Exercise) 160/84       Blood Pressure (Exit) 120/80       Heart Rate (Admit) 70 bpm       Heart Rate (Exercise) 102 bpm       Heart Rate (Exit) 66 bpm       Rating of Perceived Exertion (Exercise) 9       Duration Progress to 45 minutes of aerobic exercise without signs/symptoms of physical distress       Intensity THRR unchanged         Progression   Progression Continue to progress workloads to maintain intensity without signs/symptoms of physical distress.       Average METs 3.1         Resistance Training   Training Prescription Yes       Weight 3       Reps 10-12         Treadmill   MPH 3       Grade 1  Minutes 10       METs 3.71         Bike   Level 1       Minutes 10       METs 3         NuStep   Level 3       Minutes 10       METs 2.7          Exercise Comments:     Exercise Comments    Row Name 11/27/16 1221           Exercise Comments Pt is off to a good start with exercise           Discharge  Exercise Prescription (Final Exercise Prescription Changes):     Exercise Prescription Changes - 11/27/16 1200      Response to Exercise   Blood Pressure (Admit) 128/70   Blood Pressure (Exercise) 160/84   Blood Pressure (Exit) 120/80   Heart Rate (Admit) 70 bpm   Heart Rate (Exercise) 102 bpm   Heart Rate (Exit) 66 bpm   Rating of Perceived Exertion (Exercise) 9   Duration Progress to 45 minutes of aerobic exercise without signs/symptoms of physical distress   Intensity THRR unchanged     Progression   Progression Continue to progress workloads to maintain intensity without signs/symptoms of physical distress.   Average METs 3.1     Resistance Training   Training Prescription Yes   Weight 3   Reps 10-12     Treadmill   MPH 3   Grade 1   Minutes 10   METs 3.71     Bike   Level 1   Minutes 10   METs 3     NuStep   Level 3   Minutes 10   METs 2.7      Nutrition:  Target Goals: Understanding of nutrition guidelines, daily intake of sodium 1500mg , cholesterol 200mg , calories 30% from fat and 7% or less from saturated fats, daily to have 5 or more servings of fruits and vegetables.  Biometrics:     Pre Biometrics - 11/20/16 1129      Pre Biometrics   Height 5' 10.5" (1.791 m)   Weight 206 lb 2.1 oz (93.5 kg)   Waist Circumference 41.75 inches   Hip Circumference 42.5 inches   Waist to Hip Ratio 0.98 %   BMI (Calculated) 29.2   Triceps Skinfold 12 mm   % Body Fat 27.4 %   Grip Strength 53.5 kg   Flexibility 16 in       Nutrition Therapy Plan and Nutrition Goals:   Nutrition Discharge: Nutrition Scores:   Nutrition Goals Re-Evaluation:   Psychosocial: Target Goals: Acknowledge presence or absence of depression, maximize coping skills, provide positive support system. Participant is able to verbalize types and ability to use techniques and skills needed for reducing stress and depression.  Initial Review & Psychosocial Screening:     Initial  Psych Review & Screening - 11/12/16 [place]? Yes   Comments --  brief psychosocial assesment reveals no further intervention needed  at this time.     Barriers   Psychosocial barriers to participate in program There are no identifiable barriers or psychosocial needs.     Screening Interventions   Interventions Encouraged to exercise      Quality of Life Scores:     Quality of Life - 11/12/16  1628      Quality of Life Scores   Health/Function Pre 22.13 %   Socioeconomic Pre 15.23 %   Psych/Spiritual Pre 30 %   Family Pre 28.29 %   GLOBAL Pre 20.4 %      PHQ-9: Recent Review Flowsheet Data    Depression screen [facility] 2/9 11/20/2016   Decreased Interest 0   Down, Depressed, Hopeless 0   PHQ - 2 Score 0      Psychosocial Evaluation and Intervention:   Psychosocial Re-Evaluation:     Psychosocial Re-Evaluation    Row Name 11/26/16 1636             Psychosocial Re-Evaluation   Interventions Encouraged to attend Cardiac Rehabilitation for the exercise       Continued Psychosocial Services Needed No          Vocational Rehabilitation: Provide vocational rehab assistance to qualifying candidates.   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 Participant will state understanding/return demonstration of topics presented.  Learning Barriers/Preferences:     Learning Barriers/Preferences - 11/12/16 1350      Learning Barriers/Preferences   Learning Barriers Sight   Learning Preferences Video;Pictoral;Skilled Demonstration;Written Material;Computer/Internet       Education Topics: Count Your Pulse:  -Group instruction provided by verbal instruction, demonstration, patient participation and written materials to support subject.  Instructors address importance of being able to find your pulse and how to count your pulse when at home without a heart monitor.  Patients get hands on experience counting their pulse with staff help and individually.   Heart Attack, Angina, and Risk Factor Modification:  -Group instruction provided by verbal instruction, video, and written materials to support subject.  Instructors address signs and symptoms of angina and heart attacks.    Also discuss risk factors for heart disease and how to make changes to improve heart health risk factors.   Functional Fitness:  -Group instruction provided by verbal instruction, demonstration, patient participation, and written materials to support subject.  Instructors address safety measures for doing things around the house.  Discuss how to get up and down off the floor, how to pick things up properly, how to safely get out of a chair without assistance, and balance training.   Meditation and Mindfulness:  -Group instruction provided by verbal instruction, patient participation, and written materials to support subject.  Instructor addresses importance of mindfulness and meditation practice to help reduce stress and improve awareness.  Instructor also leads participants through a meditation exercise.    Stretching for Flexibility and Mobility:  -Group instruction provided by verbal instruction, patient participation, and written materials to support subject.  Instructors lead participants through series of stretches that are designed to increase flexibility thus improving mobility.  These stretches are additional exercise for major muscle groups that are typically performed during regular warm up and cool down.   Hands Only CPR Anytime:  -Group instruction provided by verbal  instruction, video, patient participation and written materials to support subject.  Instructors co-teach with AHA video for hands only CPR.  Participants get hands on experience with mannequins.   Nutrition  I class: Heart Healthy Eating:  -Group instruction provided by PowerPoint slides, verbal discussion, and written materials to support subject matter. The instructor gives an explanation and review of the Therapeutic Lifestyle Changes diet recommendations, which includes a discussion on lipid goals, dietary fat, sodium, fiber, plant stanol/sterol esters, sugar, and the components of a well-balanced, healthy diet.   Nutrition II class: Lifestyle Skills:  -Group instruction provided by PowerPoint slides, verbal discussion, and written materials to support subject matter. The instructor gives an explanation and review of label reading, grocery shopping for heart health, heart healthy recipe modifications, and ways to make healthier choices when eating out.   Diabetes Question & Answer:  -Group instruction provided by PowerPoint slides, verbal discussion, and written materials to support subject matter. The instructor gives an explanation and review of diabetes co-morbidities, pre- and post-prandial blood glucose goals, pre-exercise blood glucose goals, signs, symptoms, and treatment of hypoglycemia and hyperglycemia, and foot care basics.   Diabetes Blitz:  -Group instruction provided by PowerPoint slides, verbal discussion, and written materials to support subject matter. The instructor gives an explanation and review of the physiology behind type 1 and type 2 diabetes, diabetes medications and rational behind using different medications, pre- and post-prandial blood glucose recommendations and Hemoglobin A1c goals, diabetes diet, and exercise including blood glucose guidelines for exercising safely.    Portion Distortion:  -Group instruction provided by PowerPoint slides, verbal discussion,  written materials, and food models to support subject matter. The instructor gives an explanation of serving size versus portion size, changes in portions sizes over the last 20 years, and what consists of a serving from each food group.   Stress Management:  -Group instruction provided by verbal instruction, video, and written materials to support subject matter.  Instructors review role of stress in heart disease and how to cope with stress positively.     Exercising on Your Own:  -Group instruction provided by verbal instruction, power point, and written materials to support subject.  Instructors discuss benefits of exercise, components of exercise, frequency and intensity of exercise, and end points for exercise.  Also discuss use of nitroglycerin and activating EMS.  Review options of places to exercise outside of rehab.  Review guidelines for sex with heart disease.   Cardiac Drugs I:  -Group instruction provided by verbal instruction and written materials to support subject.  Instructor reviews cardiac drug classes: antiplatelets, anticoagulants, beta blockers, and statins.  Instructor discusses reasons, side effects, and lifestyle considerations for each drug class.   Cardiac Drugs II:  -Group instruction provided by verbal instruction and written materials to support subject.  Instructor reviews cardiac drug classes: angiotensin converting enzyme inhibitors (ACE-I), angiotensin II receptor blockers (ARBs), nitrates, and calcium channel blockers.  Instructor discusses reasons, side effects, and lifestyle considerations for each drug class.   Anatomy and Physiology of the Circulatory System:  -Group instruction provided by verbal instruction, video, and written materials to support subject.  Reviews functional anatomy of heart, how it relates to various diagnoses, and what role the heart plays in the overall system.   Knowledge Questionnaire Score:     Knowledge Questionnaire Score -  11/12/16 1401      Knowledge Questionnaire Score   Pre Score 21/24      Core Components/Risk Factors/Patient Goals at Admission:     Personal Goals and Risk Factors at Admission - 11/12/16 1347      Core Components/Risk Factors/Patient Goals on Admission   Lipids Yes   Intervention Provide  education and support for participant on nutrition & aerobic/resistive exercise along with prescribed medications to achieve LDL 70mg , HDL >40mg .   Expected Outcomes Short Term: Participant states understanding of desired cholesterol values and is compliant with medications prescribed. Participant is following exercise prescription and nutrition guidelines.;Long Term: Cholesterol controlled with medications as prescribed, with individualized exercise RX and with personalized nutrition plan. Value goals: LDL < 70mg , HDL > 40 mg.   Stress Yes   Intervention Offer individual and/or small group education and counseling on adjustment to heart disease, stress management and health-related lifestyle change. Teach and support self-help strategies.;Refer participants experiencing significant psychosocial distress to appropriate mental health specialists for further evaluation and treatment. When possible, include family members and significant others in education/counseling sessions.   Expected Outcomes Short Term: Participant demonstrates changes in health-related behavior, relaxation and other stress management skills, ability to obtain effective social support, and compliance with psychotropic medications if prescribed.;Long Term: Emotional wellbeing is indicated by absence of clinically significant psychosocial distress or social isolation.      Core Components/Risk Factors/Patient Goals Review:    Core Components/Risk Factors/Patient Goals at Discharge (Final Review):    ITP Comments:     ITP Comments    Row Name 11/12/16 1333           ITP Comments Medical Director- Dr. Fransico Him, MD.           Comments: Joseph Jimenez is making expected progress toward personal goals after completing 2 sessions. Recommend continued exercise and life style modification education including  stress management and relaxation techniques to decrease cardiac risk profile. Joseph Jimenez's left radial vein graft site appears more reddened and has an area that is bleeding. Joseph Jimenez has a band aid covering the area. Dr Hendrickson's office called and notified. Joseph Jimenez will not exercise today and will go to Dr Hendricson's office to have it evaluated.Will continue to monitor the patient throughout  the program. Harrell Gave RN BSN

## 2016-11-29 NOTE — Telephone Encounter (Signed)
-----   Message from Fay Records, MD sent at 11/21/2016 10:35 AM EST -----   ----- Message ----- From: Melrose Nakayama, MD Sent: 11/20/2016   1:52 PM To: Fay Records, MD

## 2016-12-02 ENCOUNTER — Ambulatory Visit (INDEPENDENT_AMBULATORY_CARE_PROVIDER_SITE_OTHER): Payer: Self-pay | Admitting: Physician Assistant

## 2016-12-02 ENCOUNTER — Encounter (HOSPITAL_COMMUNITY): Payer: BLUE CROSS/BLUE SHIELD

## 2016-12-02 VITALS — BP 123/80 | HR 65 | Resp 16 | Ht 71.0 in | Wt 200.0 lb

## 2016-12-02 DIAGNOSIS — I2510 Atherosclerotic heart disease of native coronary artery without angina pectoris: Secondary | ICD-10-CM

## 2016-12-02 DIAGNOSIS — Z951 Presence of aortocoronary bypass graft: Secondary | ICD-10-CM

## 2016-12-02 MED ORDER — CEPHALEXIN 500 MG PO CAPS: 500.0000 mg | ORAL_CAPSULE | Freq: Three times a day (TID) | ORAL | 0 refills | Status: DC

## 2016-12-02 NOTE — Progress Notes (Signed)
HPI: Patient returns for wound check.  He was last evaluated on 11/20/2016 for a small area of erythema along his radial artery harvest site. There was some evidence of suture expulsion which were removed at that time.  He presents today stating his wound is not getting better.  Current Outpatient Prescriptions  Medication Sig Dispense Refill  . aspirin EC 325 MG EC tablet Take 1 tablet (325 mg total) by mouth daily. 30 tablet 0  . atorvastatin (LIPITOR) 80 MG tablet Take 1 tablet (80 mg total) by mouth daily at 6 PM. 30 tablet 3  . metoprolol succinate (TOPROL-XL) 25 MG 24 hr tablet Take 1 tablet (25 mg total) by mouth daily. 30 tablet 3  . nitroGLYCERIN (NITROSTAT) 0.4 MG SL tablet Place 1 tablet (0.4 mg total) under the tongue every 5 (five) minutes as needed. 25 tablet 3  . cephALEXin (KEFLEX) 500 MG capsule Take 1 capsule (500 mg total) by mouth 3 (three) times daily. 30 capsule 0   No current facility-administered medications for this visit.     Physical Exam:  Left Radial artery harvest site: 2 cm area of erythema with eschar and purulent drainage present  A/P:  1. Mild cellulitis of Radial Artery site- will give 10 day course of Keflex, continue diligent wound care 2. RTC in 1 week for wound check  Brantlee Penn, PA-C [facility] [phone]

## 2016-12-04 ENCOUNTER — Encounter (HOSPITAL_COMMUNITY)
Admission: RE | Admit: 2016-12-04 | Discharge: 2016-12-04 | Disposition: A | Discharge status: Home / Self Care | Payer: BLUE CROSS/BLUE SHIELD | Source: Ambulatory Visit | Attending: Internal Medicine | Admitting: Internal Medicine

## 2016-12-04 DIAGNOSIS — Z951 Presence of aortocoronary bypass graft: Secondary | ICD-10-CM | POA: Diagnosis not present

## 2016-12-06 ENCOUNTER — Ambulatory Visit (INDEPENDENT_AMBULATORY_CARE_PROVIDER_SITE_OTHER): Payer: Self-pay | Admitting: Physician Assistant

## 2016-12-06 ENCOUNTER — Encounter (HOSPITAL_COMMUNITY)
Admission: RE | Admit: 2016-12-06 | Discharge: 2016-12-06 | Disposition: A | Discharge status: Home / Self Care | Payer: BLUE CROSS/BLUE SHIELD | Source: Ambulatory Visit | Attending: Internal Medicine | Admitting: Internal Medicine

## 2016-12-06 DIAGNOSIS — T8130XA Disruption of wound, unspecified, initial encounter: Secondary | ICD-10-CM

## 2016-12-06 DIAGNOSIS — Z951 Presence of aortocoronary bypass graft: Secondary | ICD-10-CM

## 2016-12-06 MED ORDER — LEVOFLOXACIN 750 MG PO TABS
750.0000 mg | ORAL_TABLET | Freq: Every day | ORAL | 0 refills | Status: DC
Start: 2016-12-06 — End: 2016-12-17

## 2016-12-06 NOTE — Progress Notes (Signed)
Reviewed home exercise program with pt.  Discussed mode/frequency of exercise, THRR, RPE scale and weather conditions for exercising outdoors.  Also discussed signs and symptoms and when to call Dr./911.  Pt verbalized understanding.    Cleda Mccreedy, MS 12/06/2016 11:15

## 2016-12-06 NOTE — Progress Notes (Signed)
Patient ID: Joseph Jimenez, male   DOB: [birth year], 61 y.o.   MRN: [id number]   Patient presented to Cardiac rehab today.  However, his left radial harvest site was red with a small opening.  The nurse contacted our office for [place] to come evaluate the patient.  He was evaluated in our office Monday 1/11 and given keflex for mild cellulitis.  Wound has since opened since then.  No further purulence present     Exam:  Left Radial Artery Incision: most healed, there is a small 2 cm area of erythema with mild skin dehiscence there is no purulence noted.  A/P:  1. Wound debridement was performed.  Any suture remnants were removed.  Patient was instructed to clean wound daily with soap, water, peroxide.  He will pack wound wet to dry daily 2. Will stop Keflex and start Levaquin 750 mg daily 3. RTC on 1/15 for wound check as previously scheduled

## 2016-12-06 NOTE — Progress Notes (Signed)
Mr Joseph Jimenez left arm vein harvest site continues to look red. Temperature 98.9. Blood pressure 124/70 heart rate 86. Oxygen saturation 97% on room air. Dr Hendrickson's office called and notified. Erin Barrett PA to come and look at Mr Shaede's incision. Mr Goetter did not exercise today.

## 2016-12-09 ENCOUNTER — Encounter (HOSPITAL_COMMUNITY): Payer: BLUE CROSS/BLUE SHIELD

## 2016-12-09 ENCOUNTER — Ambulatory Visit (INDEPENDENT_AMBULATORY_CARE_PROVIDER_SITE_OTHER): Payer: Self-pay | Admitting: Physician Assistant

## 2016-12-09 VITALS — BP 129/79 | HR 67 | Resp 16 | Ht 71.0 in | Wt 200.0 lb

## 2016-12-09 DIAGNOSIS — T8130XA Disruption of wound, unspecified, initial encounter: Secondary | ICD-10-CM

## 2016-12-09 DIAGNOSIS — Z951 Presence of aortocoronary bypass graft: Secondary | ICD-10-CM

## 2016-12-09 DIAGNOSIS — I2510 Atherosclerotic heart disease of native coronary artery without angina pectoris: Secondary | ICD-10-CM

## 2016-12-09 NOTE — Progress Notes (Signed)
  HPI: Patient returns for left radial wound check. He underwent a CABG x 5 on 10/10/2016 by Dr. Roxan Hockey. He was seen on 11/20/2016 with complaints of visible suture seen at left radial harvest wound. There was no cellulitis at that time. Unfortunately, on 12/03/2015 the wound appeared worse and he had mild cellulitis. He was given Keflex and instructed to continue wound care. Most recently, on 12/06/2016 his left radial harvest wound dehisced, but there was no purulence. He was instructed to stop Keflex and start Levaquin 750 mg daily. He presents today for another wound check of the left radial artery harvest.  Current Outpatient Prescriptions  Medication Sig Dispense Refill  . aspirin EC 325 MG EC tablet Take 1 tablet (325 mg total) by mouth daily. 30 tablet 0  . atorvastatin (LIPITOR) 80 MG tablet Take 1 tablet (80 mg total) by mouth daily at 6 PM. 30 tablet 3  . levofloxacin (LEVAQUIN) 750 MG tablet Take 1 tablet (750 mg total) by mouth daily. 7 tablet 0  . metoprolol succinate (TOPROL-XL) 25 MG 24 hr tablet Take 1 tablet (25 mg total) by mouth daily. 30 tablet 3  . nitroGLYCERIN (NITROSTAT) 0.4 MG SL tablet Place 1 tablet (0.4 mg total) under the tongue every 5 (five) minutes as needed. 25 tablet 3  Vital Signs: BP 129/79, HR 67, RR 16, and Oxygen saturation 95% on room air   Physical Exam: Left radial artery harvest-wound with minor slough tissue (no purulence) which I debrided. There is some granulation. I used plain packing strip with normal saline, dry 4x4, and tape to cover wound.  Impression and Plan: Left radial artery harvest wound continues to heal. Patient instructed to continue with daily dressing changes and finish Levaquin. He will return in one week for a wound check.   Nani Skillern, PA-C [facility] [phone]

## 2016-12-09 NOTE — Patient Instructions (Signed)
Continue daily wounds care-wet to dry dressings with tape. Continue Levaquin until completely done with prescription.

## 2016-12-11 ENCOUNTER — Encounter (HOSPITAL_COMMUNITY): Payer: BLUE CROSS/BLUE SHIELD

## 2016-12-13 ENCOUNTER — Encounter (HOSPITAL_COMMUNITY)
Admission: RE | Admit: 2016-12-13 | Discharge: 2016-12-13 | Disposition: A | Discharge status: Home / Self Care | Payer: BLUE CROSS/BLUE SHIELD | Source: Ambulatory Visit | Attending: Internal Medicine | Admitting: Internal Medicine

## 2016-12-13 DIAGNOSIS — Z951 Presence of aortocoronary bypass graft: Secondary | ICD-10-CM | POA: Diagnosis not present

## 2016-12-16 ENCOUNTER — Encounter (HOSPITAL_COMMUNITY): Payer: BLUE CROSS/BLUE SHIELD

## 2016-12-17 ENCOUNTER — Ambulatory Visit (INDEPENDENT_AMBULATORY_CARE_PROVIDER_SITE_OTHER): Payer: Self-pay | Admitting: Thoracic Surgery (Cardiothoracic Vascular Surgery)

## 2016-12-17 ENCOUNTER — Encounter: Payer: Self-pay | Admitting: Thoracic Surgery (Cardiothoracic Vascular Surgery)

## 2016-12-17 VITALS — BP 139/85 | HR 62 | Resp 20

## 2016-12-17 DIAGNOSIS — I2510 Atherosclerotic heart disease of native coronary artery without angina pectoris: Secondary | ICD-10-CM

## 2016-12-17 DIAGNOSIS — Z951 Presence of aortocoronary bypass graft: Secondary | ICD-10-CM

## 2016-12-17 NOTE — Progress Notes (Signed)
      [place]       [place],Joseph Jimenez             [phone]       HPI: Joseph Jimenez returns for follow-up regarding his left arm incision  He is a 61 year old man who had coronary bypass grafting 5 on 10/10/2016. He had an uncomplicated early postoperative course. His only issue has been a wound infection of his left radial artery harvest wound. He has been treated with antibiotics and local wound care. He most recently was seen in the office on 12/09/2016 by Clare Charon. At that time the infection had resolved wound was still open requiring packing. He has been packing with saline gauze and cleaning with peroxide. He thinks it is much improved.  He is not having any incisional pain.  Past Medical History:  Diagnosis Date  . Carotid artery disease ([place])    Carotid US 11/17:Bilateral ICA 1-39  . Coronary artery disease    a. LHC 11/17 with 3 vessel CAD, EF 55-65% // b. Status post CABG with Dr. Roxan Hockey 11/17 L-LAD, S-Dx, L radial-OM, S-AM/LPDA  . Heart murmur    "I've had it my whole life" (10/08/2016)  . High cholesterol   . History of kidney stones    "passed it" (10/08/2016)  . Prostate cancer [facility]) 2005      Current Outpatient Prescriptions  Medication Sig Dispense Refill  . aspirin EC 325 MG EC tablet Take 1 tablet (325 mg total) by mouth daily. 30 tablet 0  . atorvastatin (LIPITOR) 80 MG tablet Take 1 tablet (80 mg total) by mouth daily at 6 PM. 30 tablet 3  . metoprolol succinate (TOPROL-XL) 25 MG 24 hr tablet Take 1 tablet (25 mg total) by mouth daily. 30 tablet 3  . Multiple Vitamin (MULTIVITAMIN) tablet Take 1 tablet by mouth daily.    . nitroGLYCERIN (NITROSTAT) 0.4 MG SL tablet Place 1 tablet (0.4 mg total) under the tongue every 5 (five) minutes as needed. (Patient not taking: Reported on 12/17/2016) 25 tablet 3   No current facility-administered medications for this visit.     Physical Exam BP 139/85   Pulse 62   Resp 20    SpO48 31%  61 year old man in no acute distress Alert and oriented 3 with no focal deficits Sternal incision well-healed Left arm incision: no erythema, 2 cm long by 2 mm wide open area distal portion of wound with a granulating base  Impression: Joseph Jimenez is now over 2 months out from coronary bypass grafting. He is doing extremely well. His exercise tolerance is good. He did have a wound infection of his left radial artery harvest site. The infection has resolved. There is still a small than open portion of the incision that should heal without any issues. He does not have to use peroxide at this point, but should simply irrigate the area with saline a couple times a day keep covered with a dressing.  There are no restrictions on his activities from my standpoint. I did caution him to build into new activities gradually.  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  Plan:   I will be happy to see him back any time if I can be of any further assistance with his care.  Melrose Nakayama, MD [facility] [phone]

## 2016-12-18 ENCOUNTER — Encounter (HOSPITAL_COMMUNITY)
Admission: RE | Admit: 2016-12-18 | Discharge: 2016-12-18 | Disposition: A | Discharge status: Home / Self Care | Payer: BLUE CROSS/BLUE SHIELD | Source: Ambulatory Visit | Attending: Internal Medicine | Admitting: Internal Medicine

## 2016-12-18 DIAGNOSIS — Z951 Presence of aortocoronary bypass graft: Secondary | ICD-10-CM | POA: Diagnosis not present

## 2016-12-20 ENCOUNTER — Encounter (HOSPITAL_COMMUNITY)
Admission: RE | Admit: 2016-12-20 | Discharge: 2016-12-20 | Disposition: A | Discharge status: Home / Self Care | Payer: BLUE CROSS/BLUE SHIELD | Source: Ambulatory Visit | Attending: Internal Medicine | Admitting: Internal Medicine

## 2016-12-20 DIAGNOSIS — Z951 Presence of aortocoronary bypass graft: Secondary | ICD-10-CM | POA: Diagnosis not present

## 2016-12-23 ENCOUNTER — Encounter (HOSPITAL_COMMUNITY): Payer: BLUE CROSS/BLUE SHIELD

## 2016-12-25 ENCOUNTER — Encounter (HOSPITAL_COMMUNITY)
Admission: RE | Admit: 2016-12-25 | Discharge: 2016-12-25 | Disposition: A | Discharge status: Home / Self Care | Payer: BLUE CROSS/BLUE SHIELD | Source: Ambulatory Visit | Attending: Internal Medicine | Admitting: Internal Medicine

## 2016-12-25 DIAGNOSIS — Z951 Presence of aortocoronary bypass graft: Secondary | ICD-10-CM

## 2016-12-25 NOTE — Progress Notes (Signed)
Cardiac Individual Treatment Plan  Patient Details  Name: Joseph Jimenez MRN: [id number] Date of Birth: [birth year] Referring Provider:   Flowsheet Row CARDIAC REHAB PHASE II EXERCISE from 11/20/2016 in [place]  Referring Provider  Dorris Carnes MD      Initial Encounter Date:  [place] PHASE II EXERCISE from 11/20/2016 in [place]  Date  11/20/16  Referring Provider  Dorris Carnes MD      Visit Diagnosis: S/P CABG x 5  Patient's Home Medications on Admission:  Current Outpatient Prescriptions:  .  aspirin EC 325 MG EC tablet, Take 1 tablet (325 mg total) by mouth daily., Disp: 30 tablet, Rfl: 0 .  atorvastatin (LIPITOR) 80 MG tablet, Take 1 tablet (80 mg total) by mouth daily at 6 PM., Disp: 30 tablet, Rfl: 3 .  metoprolol succinate (TOPROL-XL) 25 MG 24 hr tablet, Take 1 tablet (25 mg total) by mouth daily., Disp: 30 tablet, Rfl: 3 .  Multiple Vitamin (MULTIVITAMIN) tablet, Take 1 tablet by mouth daily., Disp: , Rfl:  .  nitroGLYCERIN (NITROSTAT) 0.4 MG SL tablet, Place 1 tablet (0.4 mg total) under the tongue every 5 (five) minutes as needed. (Patient not taking: Reported on 12/17/2016), Disp: 25 tablet, Rfl: 3  Past Medical History: Past Medical History:  Diagnosis Date  . Carotid artery disease ([place])    Carotid US 11/17:Bilateral ICA 1-39  . Coronary artery disease    a. LHC 11/17 with 3 vessel CAD, EF 55-65% // b. Status post CABG with Dr. Roxan Hockey 11/17 L-LAD, S-Dx, L radial-OM, S-AM/LPDA  . Heart murmur    "I've had it my whole life" (10/08/2016)  . High cholesterol   . History of kidney stones    "passed it" (10/08/2016)  . Prostate cancer ([place]) 2005    Tobacco Use: History  Smoking Status  . Former Smoker  . Packs/day: 1.00  . Years: 20.00  . Types: Cigarettes  . Quit date: 09/15/1998  Smokeless Tobacco  . Never Used    Labs: Recent Review Flowsheet Data    Labs  for ITP Cardiac and Pulmonary Rehab Latest Ref Rng & Units 10/10/2016 10/10/2016 10/10/2016 10/10/2016 10/11/2016   Cholestrol <200 mg/dL - - - - -   LDLCALC <100 mg/dL - - - - -   HDL >40 mg/dL - - - - -   Trlycerides <150 mg/dL - - - - -   Hemoglobin A1c 4.8 - 5.6 % - - - - -   PHART 7.350 - 7.450 7.338(L) 7.328(L) 7.388 - -   PCO2ART 32.0 - 48.0 mmHg 47.2 43.2 39.7 - -   HCO3 20.0 - 28.0 mmol/L 25.5 22.9 23.9 - -   TCO2 0 - 100 mmol/L 27 24 25 24 23    ACIDBASEDEF 0.0 - 2.0 mmol/L 1.0 3.0(H) 1.0 - -   O2SAT % 96.0 97.0 98.0 - -      Capillary Blood Glucose: Lab Results  Component Value Date   GLUCAP 125 (H) 10/12/2016   GLUCAP 121 (H) 10/12/2016   GLUCAP 115 (H) 10/11/2016   GLUCAP 118 (H) 10/11/2016   GLUCAP 125 (H) 10/11/2016     Exercise Target Goals:    Exercise Program Goal: Individual exercise prescription set with THRR, safety & activity barriers. Participant demonstrates ability to understand and report RPE using BORG scale, to self-measure pulse accurately, and to acknowledge the importance of the exercise prescription.  Exercise Prescription Goal: Starting with aerobic  activity 30 plus minutes a day, 3 days per week for initial exercise prescription. Provide home exercise prescription and guidelines that participant acknowledges understanding prior to discharge.  Activity Barriers & Risk Stratification:     Activity Barriers & Cardiac Risk Stratification - 11/12/16 1419      Activity Barriers & Cardiac Risk Stratification   Activity Barriers None   Cardiac Risk Stratification High      6 Minute Walk:     6 Minute Walk    Row Name 11/20/16 1127         6 Minute Walk   Phase Initial     Distance 1834 feet     Walk Time 6 minutes     # of Rest Breaks 0     MPH 3.5     METS 4.5     RPE 7     VO2 Peak 15.7     Symptoms No     Resting HR 67 bpm     Resting BP 110/60     Max Ex. HR 95 bpm     Max Ex. BP 154/84     2 Minute Post BP 120/72         Initial Exercise Prescription:     Initial Exercise Prescription - 11/20/16 1100      Date of Initial Exercise RX and Referring Provider   Date 11/20/16   Referring Provider Dorris Carnes MD     Treadmill   MPH 3   Grade 1   Minutes 10   METs 3.71     Bike   Level 1   Minutes 10   METs 3.7     NuStep   Level 3   Minutes 10   METs 3     Prescription Details   Frequency (times per week) 3   Duration Progress to 45 minutes of aerobic exercise without signs/symptoms of physical distress     Intensity   THRR 40-80% of Max Heartrate 64-129   Ratings of Perceived Exertion 11-13   Perceived Dyspnea 0-4     Progression   Progression Continue to progress workloads to maintain intensity without signs/symptoms of physical distress.     Resistance Training   Training Prescription Yes   Weight 3   Reps 10-12      Perform Capillary Blood Glucose checks as needed.  Exercise Prescription Changes:     Exercise Prescription Changes    Row Name 11/27/16 1200 12/23/16 1400           Response to Exercise   Blood Pressure (Admit) 128/70 120/78      Blood Pressure (Exercise) 160/84 168/90      Blood Pressure (Exit) 120/80 114/70      Heart Rate (Admit) 70 bpm 72 bpm      Heart Rate (Exercise) 102 bpm 118 bpm      Heart Rate (Exit) 66 bpm 68 bpm      Rating of Perceived Exertion (Exercise) 9 11      Duration Progress to 45 minutes of aerobic exercise without signs/symptoms of physical distress Progress to 45 minutes of aerobic exercise without signs/symptoms of physical distress      Intensity THRR unchanged THRR unchanged        Progression   Progression Continue to progress workloads to maintain intensity without signs/symptoms of physical distress. Continue to progress workloads to maintain intensity without signs/symptoms of physical distress.      Average METs 3.1 4.6  Resistance Training   Training Prescription Yes Yes      Weight 3 8lb      Reps 10-12  10-12        Treadmill   MPH 3 3.2      Grade 1 5      Minutes 10 10      METs 3.71 5.66        Bike   Level 1 2      Minutes 10 10      METs 3 5.01        NuStep   Level 3 3      Minutes 10 10      METs 2.7 3.1        Home Exercise Plan   Plans to continue exercise at  - Home      Frequency  - Add 4 additional days to program exercise sessions.         Exercise Comments:     Exercise Comments    Row Name 11/27/16 1221 12/23/16 1427         Exercise Comments Pt is off to a good start with exercise  Reviewed METs and goals with pt and pt is doing well with exercise          Discharge Exercise Prescription (Final Exercise Prescription Changes):     Exercise Prescription Changes - 12/23/16 1400      Response to Exercise   Blood Pressure (Admit) 120/78   Blood Pressure (Exercise) 168/90   Blood Pressure (Exit) 114/70   Heart Rate (Admit) 72 bpm   Heart Rate (Exercise) 118 bpm   Heart Rate (Exit) 68 bpm   Rating of Perceived Exertion (Exercise) 11   Duration Progress to 45 minutes of aerobic exercise without signs/symptoms of physical distress   Intensity THRR unchanged     Progression   Progression Continue to progress workloads to maintain intensity without signs/symptoms of physical distress.   Average METs 4.6     Resistance Training   Training Prescription Yes   Weight 8lb   Reps 10-12     Treadmill   MPH 3.2   Grade 5   Minutes 10   METs 5.66     Bike   Level 2   Minutes 10   METs 5.01     NuStep   Level 3   Minutes 10   METs 3.1     Home Exercise Plan   Plans to continue exercise at Home   Frequency Add 4 additional days to program exercise sessions.      Nutrition:  Target Goals: Understanding of nutrition guidelines, daily intake of sodium 1500mg , cholesterol 200mg , calories 30% from fat and 7% or less from saturated fats, daily to have 5 or more servings of fruits and vegetables.  Biometrics:     Pre Biometrics - 11/20/16  1129      Pre Biometrics   Height 5' 10.5" (1.791 m)   Weight 206 lb 2.1 oz (93.5 kg)   Waist Circumference 41.75 inches   Hip Circumference 42.5 inches   Waist to Hip Ratio 0.98 %   BMI (Calculated) 29.2   Triceps Skinfold 12 mm   % Body Fat 27.4 %   Grip Strength 53.5 kg   Flexibility 16 in       Nutrition Therapy Plan and Nutrition Goals:   Nutrition Discharge: Nutrition Scores:   Nutrition Goals Re-Evaluation:   Psychosocial: Target Goals: Acknowledge presence or absence of depression,  maximize coping skills, provide positive support system. Participant is able to verbalize types and ability to use techniques and skills needed for reducing stress and depression.  Initial Review & Psychosocial Screening:     Initial Psych Review & Screening - 11/12/16 [place]? Yes   Comments --  brief psychosocial assesment reveals no further intervention needed  at this time.     Barriers   Psychosocial barriers to participate in program There are no identifiable barriers or psychosocial needs.     Screening Interventions   Interventions Encouraged to exercise      Quality of Life Scores:     Quality of Life - 11/12/16 1628      Quality of Life Scores   Health/Function Pre 22.13 %   Socioeconomic Pre 15.23 %   Psych/Spiritual Pre 30 %   Family Pre 28.29 %   GLOBAL Pre 20.4 %      PHQ-9: Recent Review Flowsheet Data    Depression screen [facility] 2/9 11/20/2016   Decreased Interest 0   Down, Depressed, Hopeless 0   PHQ - 2 Score 0      Psychosocial Evaluation and Intervention:   Psychosocial Re-Evaluation:     Psychosocial Re-Evaluation    Row Name 11/26/16 1636 12/25/16 1807           Psychosocial Re-Evaluation   Interventions Encouraged to attend Cardiac Rehabilitation for the exercise Encouraged to attend Cardiac Rehabilitation for the exercise      Continued Psychosocial Services Needed No No          Vocational Rehabilitation: Provide vocational rehab assistance to qualifying candidates.   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 Participant will state understanding/return demonstration of topics presented.  Learning Barriers/Preferences:     Learning Barriers/Preferences - 11/12/16 1350      Learning Barriers/Preferences   Learning Barriers Sight   Learning Preferences Video;Pictoral;Skilled Demonstration;Written Material;Computer/Internet      Education Topics: Count Your Pulse:  -Group instruction provided by verbal instruction, demonstration, patient participation and written materials to support subject.  Instructors address importance of being able to find your pulse and how to count your pulse when at home without a heart monitor.  Patients get hands on experience counting their pulse with staff help and individually.   Heart Attack, Angina, and Risk Factor Modification:  -Group instruction provided by verbal instruction, video, and written materials to support subject.  Instructors address signs and symptoms of angina and heart attacks.    Also discuss risk factors for heart disease and how to make changes to improve heart health risk factors. Flowsheet Row CARDIAC REHAB PHASE II EXERCISE from 12/18/2016 in [place]  Date  12/18/16  Instruction Review Code  2- meets goals/outcomes      Functional Fitness:  -Group instruction provided by verbal instruction, demonstration, patient participation, and written materials to support subject.  Instructors address safety measures for  doing things around the house.  Discuss how to get up and down off the floor, how to pick things up  properly, how to safely get out of a chair without assistance, and balance training.   Meditation and Mindfulness:  -Group instruction provided by verbal instruction, patient participation, and written materials to support subject.  Instructor addresses importance of mindfulness and meditation practice to help reduce stress and improve awareness.  Instructor also leads participants through a meditation exercise.    Stretching for Flexibility and Mobility:  -Group instruction provided by verbal instruction, patient participation, and written materials to support subject.  Instructors lead participants through series of stretches that are designed to increase flexibility thus improving mobility.  These stretches are additional exercise for major muscle groups that are typically performed during regular warm up and cool down.   Hands Only CPR Anytime:  -Group instruction provided by verbal instruction, video, patient participation and written materials to support subject.  Instructors co-teach with AHA video for hands only CPR.  Participants get hands on experience with mannequins.   Nutrition I class: Heart Healthy Eating:  -Group instruction provided by PowerPoint slides, verbal discussion, and written materials to support subject matter. The instructor gives an explanation and review of the Therapeutic Lifestyle Changes diet recommendations, which includes a discussion on lipid goals, dietary fat, sodium, fiber, plant stanol/sterol esters, sugar, and the components of a well-balanced, healthy diet.   Nutrition II class: Lifestyle Skills:  -Group instruction provided by PowerPoint slides, verbal discussion, and written materials to support subject matter. The instructor gives an explanation and review of label reading, grocery shopping for heart health, heart healthy recipe modifications, and ways  to make healthier choices when eating out.   Diabetes Question & Answer:  -Group instruction provided by PowerPoint slides, verbal discussion, and written materials to support subject matter. The instructor gives an explanation and review of diabetes co-morbidities, pre- and post-prandial blood glucose goals, pre-exercise blood glucose goals, signs, symptoms, and treatment of hypoglycemia and hyperglycemia, and foot care basics.   Diabetes Blitz:  -Group instruction provided by PowerPoint slides, verbal discussion, and written materials to support subject matter. The instructor gives an explanation and review of the physiology behind type 1 and type 2 diabetes, diabetes medications and rational behind using different medications, pre- and post-prandial blood glucose recommendations and Hemoglobin A1c goals, diabetes diet, and exercise including blood glucose guidelines for exercising safely.    Portion Distortion:  -Group instruction provided by PowerPoint slides, verbal discussion, written materials, and food models to support subject matter. The instructor gives an explanation of serving size versus portion size, changes in portions sizes over the last 20 years, and what consists of a serving from each food group.   Stress Management:  -Group instruction provided by verbal instruction, video, and written materials to support subject matter.  Instructors review role of stress in heart disease and how to cope with stress positively.   Flowsheet Row CARDIAC REHAB PHASE II EXERCISE from 12/18/2016 in [place]  Date  12/13/16  Instruction Review Code  2- meets goals/outcomes      Exercising on Your Own:  -Group instruction provided by verbal instruction, power point, and written materials to support subject.  Instructors discuss benefits of exercise, components of exercise, frequency and intensity of exercise, and end points for exercise.  Also discuss use of  nitroglycerin and activating EMS.  Review options of places to exercise outside of rehab.  Review guidelines for sex with heart disease.   Cardiac Drugs I:  -Group instruction provided by verbal instruction and written materials to support subject.  Instructor reviews cardiac drug classes: antiplatelets, anticoagulants, beta blockers, and statins.  Instructor discusses reasons, side effects, and lifestyle considerations for each drug class.   Cardiac Drugs II:  -Group instruction provided by verbal instruction and written materials to support subject.  Instructor reviews cardiac drug classes: angiotensin converting enzyme inhibitors (ACE-I), angiotensin II receptor blockers (ARBs), nitrates, and calcium channel blockers.  Instructor discusses reasons, side effects, and lifestyle considerations for each drug class.   Anatomy and Physiology of the Circulatory System:  -Group instruction provided by verbal instruction, video, and written materials to support subject.  Reviews functional anatomy of heart, how it relates to various diagnoses, and what role the heart plays in the overall system.   Knowledge Questionnaire Score:     Knowledge Questionnaire Score - 11/12/16 1401      Knowledge Questionnaire Score   Pre Score 21/24      Core Components/Risk Factors/Patient Goals at Admission:     Personal Goals and Risk Factors at Admission - 11/12/16 1347      Core Components/Risk Factors/Patient Goals on Admission   Lipids Yes   Intervention Provide education and support for participant on nutrition & aerobic/resistive exercise along with prescribed medications to achieve LDL 70mg , HDL >40mg .   Expected Outcomes Short Term: Participant states understanding of desired cholesterol values and is compliant with medications prescribed. Participant is following exercise prescription and nutrition guidelines.;Long Term: Cholesterol controlled with medications as prescribed, with individualized  exercise RX and with personalized nutrition plan. Value goals: LDL < 70mg , HDL > 40 mg.   Stress Yes   Intervention Offer individual and/or small group education and counseling on adjustment to heart disease, stress management and health-related lifestyle change. Teach and support self-help strategies.;Refer participants experiencing significant psychosocial distress to appropriate mental health specialists for further evaluation and treatment. When possible, include family members and significant others in education/counseling sessions.   Expected Outcomes Short Term: Participant demonstrates changes in health-related behavior, relaxation and other stress management skills, ability to obtain effective social support, and compliance with psychotropic medications if prescribed.;Long Term: Emotional wellbeing is indicated by absence of clinically significant psychosocial distress or social isolation.      Core Components/Risk Factors/Patient Goals Review:      Goals and Risk Factor Review    Row Name 12/23/16 1427             Core Components/Risk Factors/Patient Goals Review   Personal Goals Review Increase Strength and Stamina       Review Pt is currently exercising at [employer] everyday (walking 45 minutes/day on treadmill).  He is anticipating passing his stress test this summer in order to return to work        Expected Outcomes Continue with exercise routine in order to improve cardiorespiratory fitness           Core Components/Risk Factors/Patient Goals at Discharge (Final Review):      Goals and Risk Factor Review - 12/23/16 1427      Core Components/Risk Factors/Patient Goals Review   Personal Goals Review Increase Strength and Stamina   Review Pt is currently exercising at [employer] everyday (walking 45 minutes/day on treadmill).  He is anticipating passing his stress test this summer in order to return to work    Expected Outcomes Continue with exercise routine in order to  improve cardiorespiratory fitness       ITP Comments:     ITP Comments    Row Name 11/12/16 1333  ITP Comments Medical Director- Dr. Fransico Him, MD.          Comments: [name] is making expected progress toward personal goals after completing 7 sessions. Recommend continued exercise and life style modification education including  stress management and relaxation techniques to decrease cardiac risk profile. [name] continues to enjoy coming to exercise at cardiac rehab twice a week. Frank's left radial harvest site continues to look better.Barnet Pall, RN,BSN 12/25/2016 6:11 PM

## 2016-12-27 ENCOUNTER — Encounter (HOSPITAL_COMMUNITY)
Admission: RE | Admit: 2016-12-27 | Discharge: 2016-12-27 | Disposition: A | Discharge status: Home / Self Care | Payer: BLUE CROSS/BLUE SHIELD | Source: Ambulatory Visit | Attending: Internal Medicine | Admitting: Internal Medicine

## 2016-12-27 DIAGNOSIS — Z951 Presence of aortocoronary bypass graft: Secondary | ICD-10-CM | POA: Diagnosis not present

## 2016-12-30 ENCOUNTER — Other Ambulatory Visit: Payer: BLUE CROSS/BLUE SHIELD | Admitting: *Deleted

## 2016-12-30 ENCOUNTER — Encounter (HOSPITAL_COMMUNITY): Payer: BLUE CROSS/BLUE SHIELD

## 2016-12-30 DIAGNOSIS — E7849 Other hyperlipidemia: Secondary | ICD-10-CM

## 2016-12-30 DIAGNOSIS — E784 Other hyperlipidemia: Secondary | ICD-10-CM | POA: Diagnosis not present

## 2016-12-30 LAB — LIPID PANEL
CHOL/HDL RATIO: 3 ratio (ref 0.0–5.0)
CHOLESTEROL TOTAL: 154 mg/dL (ref 100–199)
HDL: 51 mg/dL (ref >39)
LDL Calculated: 84 mg/dL (ref 0–99)
TRIGLYCERIDES: 95 mg/dL (ref 0–149)
VLDL Cholesterol Cal: 19 mg/dL (ref 5–40)

## 2016-12-30 LAB — HEPATIC FUNCTION PANEL
ALT: 16 IU/L (ref 0–44)
AST: 20 IU/L (ref 0–40)
Albumin: 4.4 g/dL (ref 3.5–5.5)
Alkaline Phosphatase: 84 IU/L (ref 39–117)
BILIRUBIN, DIRECT: 0.09 mg/dL (ref 0.00–0.40)
Bilirubin Total: 0.3 mg/dL (ref 0.0–1.2)
Total Protein: 6.7 g/dL (ref 6.0–8.5)

## 2016-12-30 NOTE — Addendum Note (Signed)
Addended by: Eulis Foster on: 12/30/2016 08:05 AM   Modules accepted: Orders

## 2017-01-01 ENCOUNTER — Encounter (HOSPITAL_COMMUNITY)
Admission: RE | Admit: 2017-01-01 | Discharge: 2017-01-01 | Disposition: A | Discharge status: Home / Self Care | Payer: BLUE CROSS/BLUE SHIELD | Source: Ambulatory Visit | Attending: Internal Medicine | Admitting: Internal Medicine

## 2017-01-01 DIAGNOSIS — Z951 Presence of aortocoronary bypass graft: Secondary | ICD-10-CM

## 2017-01-03 ENCOUNTER — Telehealth: Payer: Self-pay | Admitting: Internal Medicine

## 2017-01-03 ENCOUNTER — Encounter (HOSPITAL_COMMUNITY)
Admission: RE | Admit: 2017-01-03 | Discharge: 2017-01-03 | Disposition: A | Discharge status: Home / Self Care | Payer: BLUE CROSS/BLUE SHIELD | Source: Ambulatory Visit | Attending: Internal Medicine | Admitting: Internal Medicine

## 2017-01-03 DIAGNOSIS — Z951 Presence of aortocoronary bypass graft: Secondary | ICD-10-CM

## 2017-01-03 DIAGNOSIS — E785 Hyperlipidemia, unspecified: Secondary | ICD-10-CM

## 2017-01-03 DIAGNOSIS — I2510 Atherosclerotic heart disease of native coronary artery without angina pectoris: Secondary | ICD-10-CM

## 2017-01-03 MED ORDER — METOPROLOL SUCCINATE ER 25 MG PO TB24: 25.0000 mg | ORAL_TABLET | Freq: Every day | ORAL | 3 refills | Status: DC

## 2017-01-03 MED ORDER — ATORVASTATIN CALCIUM 80 MG PO TABS: 80.0000 mg | ORAL_TABLET | Freq: Every day | ORAL | 3 refills | Status: DC

## 2017-01-03 MED ORDER — EZETIMIBE 10 MG PO TABS: 10.0000 mg | ORAL_TABLET | Freq: Every day | ORAL | 1 refills | Status: DC

## 2017-01-03 NOTE — Telephone Encounter (Signed)
Notes Recorded by Fay Records, MD on 01/01/2017  Tried to call No answer LDL is much better than before surgery NOw 84 HDL good at 51 Goal however for LDL is lower  I would recomm continuing Lipitor 80 Add Zetia 10 mg to regimen ([place] if cost better) F/U lipid in 8 wks with AST Goal for LDL at least 70 or less   I discussed with pt, pt agreed to start Zetia 10 mg daily, fasting lipid profile/AST scheduled for 03/03/17. Pt asked me to send prescription to [facility] and will let me know if too expensive.

## 2017-01-03 NOTE — Telephone Encounter (Signed)
Follow up ° ° ° ° °Returned a call to the nurse °

## 2017-01-03 NOTE — Progress Notes (Signed)
Joseph Jimenez 61 y.o. male Nutrition Note Spoke with pt.  Nutrition Survey reviewed with pt. There were several ways the pt could improve his eating habits when MEDFICTS initially completed. Per discussion, feel pt is following Step 2 of the Therapeutic Lifestyle Changes diet. Pt wants to lose wt, but has maintained his wt at this point. Wt loss tips reviewed. Barrier to wt loss appear to be portion control. Pt expressed understanding of the information reviewed. Pt aware of nutrition education classes offered. Lab Results  Component Value Date   HGBA1C 5.6 10/10/2016   Wt Readings from Last 3 Encounters:  12/09/16 200 lb (90.7 kg)  12/02/16 200 lb (90.7 kg)  11/20/16 206 lb 2.1 oz (93.5 kg)   Lipid Panel     Component Value Date/Time   CHOL 154 12/30/2016 0805   TRIG 95 12/30/2016 0805   HDL 51 12/30/2016 0805   CHOLHDL 3.0 12/30/2016 0805   CHOLHDL 4.3 10/07/2016 0816   VLDL 21 10/07/2016 0816   LDLCALC 84 12/30/2016 0805   Nutrition Diagnosis ? Food-and nutrition-related knowledge deficit related to lack of exposure to information as related to diagnosis of: ? CVD  ? Overweight related to excessive energy intake as evidenced by a BMI of 29.2 Nutrition Intervention ? Benefits of adopting Therapeutic Lifestyle Changes discussed when Medficts reviewed. ? Pt to attend the Portion Distortion class ? Pt given handouts for: ? Nutrition I class ? Nutrition II class ? Continue client-centered nutrition education by RD, as part of interdisciplinary care.  Goal(s) ? Pt to identify and limit food sources of saturated fat, trans fat, and sodium ? Pt to identify food quantities necessary to achieve weight loss of of 1-2 lb/week to a wt loss goal of 6-20 lb at graduation from cardiac rehab.   Monitor and Evaluate progress toward nutrition goal with team.  Derek Mound, M.Ed, RD, LDN, CDE 01/03/2017 10:53 AM

## 2017-01-03 NOTE — Telephone Encounter (Signed)
LMTCB

## 2017-01-03 NOTE — Telephone Encounter (Signed)
New Message     Dr Harrington Challenger was suppose to put him on a new medication Wednesday for lowering his cholesterol , but when she called in the medication it was not there, did she change her mind?   Please call after 930a he is at cardiac rehab right now

## 2017-01-06 ENCOUNTER — Encounter (HOSPITAL_COMMUNITY): Payer: BLUE CROSS/BLUE SHIELD

## 2017-01-08 ENCOUNTER — Encounter (HOSPITAL_COMMUNITY)
Admission: RE | Admit: 2017-01-08 | Discharge: 2017-01-08 | Disposition: A | Discharge status: Home / Self Care | Payer: BLUE CROSS/BLUE SHIELD | Source: Ambulatory Visit | Attending: Internal Medicine | Admitting: Internal Medicine

## 2017-01-08 ENCOUNTER — Encounter: Payer: Self-pay | Admitting: Internal Medicine

## 2017-01-08 DIAGNOSIS — Z951 Presence of aortocoronary bypass graft: Secondary | ICD-10-CM | POA: Diagnosis not present

## 2017-01-10 ENCOUNTER — Telehealth: Payer: Self-pay | Admitting: Internal Medicine

## 2017-01-10 ENCOUNTER — Encounter (HOSPITAL_COMMUNITY): Payer: BLUE CROSS/BLUE SHIELD

## 2017-01-10 ENCOUNTER — Encounter: Payer: Self-pay | Admitting: *Deleted

## 2017-01-10 NOTE — Telephone Encounter (Signed)
Disability form has been completed and faxed to [place]. Fax#: [phone].  Called patient and informed.

## 2017-01-10 NOTE — Telephone Encounter (Signed)
New message      Talk to Dr Harrington Challenger.  Pt states he is a "friend" of Dr Harrington Challenger.  Talk to her regarding completing a disability form that he and Dr Harrington Challenger has discussed in the past.  Please call

## 2017-01-10 NOTE — Progress Notes (Signed)
Letter created by Dr. Harrington Challenger and faxed to [place] Department of [employer]porary Disability.  Fax #: [phone].  This afternoon received walk in form from same department of temp. Disability from patient for Dr. Harrington Challenger to fill out and fax to same number.  Form given to MD.

## 2017-01-13 ENCOUNTER — Encounter (HOSPITAL_COMMUNITY): Payer: BLUE CROSS/BLUE SHIELD

## 2017-01-15 ENCOUNTER — Encounter (HOSPITAL_COMMUNITY)
Admission: RE | Admit: 2017-01-15 | Discharge: 2017-01-15 | Disposition: A | Discharge status: Home / Self Care | Payer: BLUE CROSS/BLUE SHIELD | Source: Ambulatory Visit | Attending: Internal Medicine | Admitting: Internal Medicine

## 2017-01-15 DIAGNOSIS — Z951 Presence of aortocoronary bypass graft: Secondary | ICD-10-CM | POA: Diagnosis not present

## 2017-01-17 ENCOUNTER — Encounter (HOSPITAL_COMMUNITY)
Admission: RE | Admit: 2017-01-17 | Discharge: 2017-01-17 | Disposition: A | Discharge status: Home / Self Care | Payer: BLUE CROSS/BLUE SHIELD | Source: Ambulatory Visit | Attending: Internal Medicine | Admitting: Internal Medicine

## 2017-01-17 DIAGNOSIS — Z951 Presence of aortocoronary bypass graft: Secondary | ICD-10-CM | POA: Diagnosis not present

## 2017-01-20 ENCOUNTER — Encounter (HOSPITAL_COMMUNITY)
Admission: RE | Admit: 2017-01-20 | Discharge: 2017-01-20 | Disposition: A | Discharge status: Home / Self Care | Payer: BLUE CROSS/BLUE SHIELD | Source: Ambulatory Visit | Attending: Internal Medicine | Admitting: Internal Medicine

## 2017-01-20 DIAGNOSIS — Z951 Presence of aortocoronary bypass graft: Secondary | ICD-10-CM

## 2017-01-22 ENCOUNTER — Encounter (HOSPITAL_COMMUNITY): Admission: RE | Admit: 2017-01-22 | Payer: BLUE CROSS/BLUE SHIELD | Source: Ambulatory Visit

## 2017-01-22 NOTE — Progress Notes (Signed)
Cardiac Individual Treatment Plan  Patient Details  Name: Joseph Jimenez MRN: [id number] Date of Birth: 11-11-1956 Referring Provider:   Flowsheet Row CARDIAC REHAB PHASE II EXERCISE from 11/20/2016 in [place]  Referring Provider  Dorris Carnes MD      Initial Encounter Date:  [place] PHASE II EXERCISE from 11/20/2016 in [place]  Date  11/20/16  Referring Provider  Dorris Carnes MD      Visit Diagnosis: S/P CABG x 5  Patient's Home Medications on Admission:  Current Outpatient Prescriptions:  .  aspirin EC 325 MG EC tablet, Take 1 tablet (325 mg total) by mouth daily., Disp: 30 tablet, Rfl: 0 .  atorvastatin (LIPITOR) 80 MG tablet, Take 1 tablet (80 mg total) by mouth daily at 6 PM., Disp: 90 tablet, Rfl: 3 .  ezetimibe (ZETIA) 10 MG tablet, Take 1 tablet (10 mg total) by mouth daily., Disp: 90 tablet, Rfl: 1 .  metoprolol succinate (TOPROL-XL) 25 MG 24 hr tablet, Take 1 tablet (25 mg total) by mouth daily., Disp: 90 tablet, Rfl: 3 .  Multiple Vitamin (MULTIVITAMIN) tablet, Take 1 tablet by mouth daily., Disp: , Rfl:  .  nitroGLYCERIN (NITROSTAT) 0.4 MG SL tablet, Place 1 tablet (0.4 mg total) under the tongue every 5 (five) minutes as needed. (Patient not taking: Reported on 12/17/2016), Disp: 25 tablet, Rfl: 3  Past Medical History: Past Medical History:  Diagnosis Date  . Carotid artery disease ([place])    Carotid US 11/17:Bilateral ICA 1-39  . Coronary artery disease    a. LHC 11/17 with 3 vessel CAD, EF 55-65% // b. Status post CABG with Dr. Roxan Hockey 11/17 L-LAD, S-Dx, L radial-OM, S-AM/LPDA  . Heart murmur    "I've had it my whole life" (10/08/2016)  . High cholesterol   . History of kidney stones    "passed it" (10/08/2016)  . Prostate cancer ([place]) 2005    Tobacco Use: History  Smoking Status  . Former Smoker  . Packs/day: 1.00  . Years: 20.00  . Types: Cigarettes  . Quit  date: 09/15/1998  Smokeless Tobacco  . Never Used    Labs: Recent Review Flowsheet Data    Labs for ITP Cardiac and Pulmonary Rehab Latest Ref Rng & Units 10/10/2016 10/10/2016 10/10/2016 10/11/2016 12/30/2016   Cholestrol 100 - 199 mg/dL - - - - 154   LDLCALC 0 - 99 mg/dL - - - - 84   HDL >39 mg/dL - - - - 51   Trlycerides 0 - 149 mg/dL - - - - 95   Hemoglobin A1c 4.8 - 5.6 % - - - - -   PHART 7.350 - 7.450 7.328(L) 7.388 - - -   PCO2ART 32.0 - 48.0 mmHg 43.2 39.7 - - -   HCO3 20.0 - 28.0 mmol/L 22.9 23.9 - - -   TCO2 0 - 100 mmol/L 24 25 24 23  -   ACIDBASEDEF 0.0 - 2.0 mmol/L 3.0(H) 1.0 - - -   O2SAT % 97.0 98.0 - - -      Capillary Blood Glucose: Lab Results  Component Value Date   GLUCAP 125 (H) 10/12/2016   GLUCAP 121 (H) 10/12/2016   GLUCAP 115 (H) 10/11/2016   GLUCAP 118 (H) 10/11/2016   GLUCAP 125 (H) 10/11/2016     Exercise Target Goals:    Exercise Program Goal: Individual exercise prescription set with THRR, safety & activity barriers. Participant demonstrates ability to  understand and report RPE using BORG scale, to self-measure pulse accurately, and to acknowledge the importance of the exercise prescription.  Exercise Prescription Goal: Starting with aerobic activity 30 plus minutes a day, 3 days per week for initial exercise prescription. Provide home exercise prescription and guidelines that participant acknowledges understanding prior to discharge.  Activity Barriers & Risk Stratification:     Activity Barriers & Cardiac Risk Stratification - 11/12/16 1419      Activity Barriers & Cardiac Risk Stratification   Activity Barriers None   Cardiac Risk Stratification High      6 Minute Walk:     6 Minute Walk    Row Name 11/20/16 1127         6 Minute Walk   Phase Initial     Distance 1834 feet     Walk Time 6 minutes     # of Rest Breaks 0     MPH 3.5     METS 4.5     RPE 7     VO2 Peak 15.7     Symptoms No     Resting HR 67 bpm      Resting BP 110/60     Max Ex. HR 95 bpm     Max Ex. BP 154/84     2 Minute Post BP 120/72        Oxygen Initial Assessment:   Oxygen Re-Evaluation:   Oxygen Discharge (Final Oxygen Re-Evaluation):   Initial Exercise Prescription:     Initial Exercise Prescription - 11/20/16 1100      Date of Initial Exercise RX and Referring Provider   Date 11/20/16   Referring Provider Dorris Carnes MD     Treadmill   MPH 3   Grade 1   Minutes 10   METs 3.71     Bike   Level 1   Minutes 10   METs 3.7     NuStep   Level 3   Minutes 10   METs 3     Prescription Details   Frequency (times per week) 3   Duration Progress to 45 minutes of aerobic exercise without signs/symptoms of physical distress     Intensity   THRR 40-80% of Max Heartrate 64-129   Ratings of Perceived Exertion 11-13   Perceived Dyspnea 0-4     Progression   Progression Continue to progress workloads to maintain intensity without signs/symptoms of physical distress.     Resistance Training   Training Prescription Yes   Weight 3   Reps 10-12      Perform Capillary Blood Glucose checks as needed.  Exercise Prescription Changes:      Exercise Prescription Changes    Row Name 11/27/16 1200 12/23/16 1400 01/20/17 1600         Response to Exercise   Blood Pressure (Admit) 128/70 120/78 140/80     Blood Pressure (Exercise) 160/84 168/90 170/80     Blood Pressure (Exit) 120/80 114/70 134/78     Heart Rate (Admit) 70 bpm 72 bpm 78 bpm     Heart Rate (Exercise) 102 bpm 118 bpm 125 bpm     Heart Rate (Exit) 66 bpm 68 bpm 111 bpm     Rating of Perceived Exertion (Exercise) 9 11 12      Duration Progress to 45 minutes of aerobic exercise without signs/symptoms of physical distress Progress to 45 minutes of aerobic exercise without signs/symptoms of physical distress Progress to 45 minutes of aerobic exercise without signs/symptoms of  physical distress     Intensity THRR unchanged THRR unchanged THRR  unchanged       Progression   Progression Continue to progress workloads to maintain intensity without signs/symptoms of physical distress. Continue to progress workloads to maintain intensity without signs/symptoms of physical distress. Continue to progress workloads to maintain intensity without signs/symptoms of physical distress.     Average METs 3.1 4.6 4.8       Resistance Training   Training Prescription Yes Yes Yes     Weight 3 8lb 10     Reps 10-12 10-12 10-15       Treadmill   MPH 3 3.2 3.2     Grade 1 5 5      Minutes 10 10 10      METs 3.71 5.66 5.66       Bike   Level 1 2 2      Minutes 10 10 10      METs 3 5.01 5.01       NuStep   Level 3 3 4      Minutes 10 10 10      METs 2.7 3.1 4.2       Home Exercise Plan   Plans to continue exercise at  - Home Home (comment)     Frequency  - Add 4 additional days to program exercise sessions. Add 4 additional days to program exercise sessions.        Exercise Comments:      Exercise Comments    Row Name 11/27/16 1221 12/23/16 1427 01/22/17 1706       Exercise Comments Pt is off to a good start with exercise  Reviewed METs and goals with pt and pt is doing well with exercise  Reviewed METs and goals with pt and pt is doing well with exercise and tolerating workload increases very well.          Exercise Goals and Review:      Exercise Goals    Row Name 01/22/17 1706             Exercise Goals   Increase Physical Activity Yes       Intervention Provide advice, education, support and counseling about physical activity/exercise needs.;Develop an individualized exercise prescription for aerobic and resistive training based on initial evaluation findings, risk stratification, comorbidities and participant's personal goals.       Expected Outcomes Achievement of increased cardiorespiratory fitness and enhanced flexibility, muscular endurance and strength shown through measurements of functional capacity and personal  statement of participant.       Increase Strength and Stamina Yes       Intervention Provide advice, education, support and counseling about physical activity/exercise needs.;Develop an individualized exercise prescription for aerobic and resistive training based on initial evaluation findings, risk stratification, comorbidities and participant's personal goals.       Expected Outcomes Achievement of increased cardiorespiratory fitness and enhanced flexibility, muscular endurance and strength shown through measurements of functional capacity and personal statement of participant.          Exercise Goals Re-Evaluation :     Exercise Goals Re-Evaluation    Row Name 01/20/17 1657             Exercise Goal Re-Evaluation   Exercise Goals Review Increase Strenth and Stamina       Comments Reviewed METs and goals with pt.  Pt is doing very well with exercise and is ready to begin a strength training program (pending Dr.'s permisison).  Pt  is currently playing golf 2-3xs/week and walking 36min/day       Expected Outcomes Continue with exercise prescription in CR and increase workloads as tolerated.  Continue with HEP and begin resistance training in CR pending Dr. permission.            Discharge Exercise Prescription (Final Exercise Prescription Changes):     Exercise Prescription Changes - 01/20/17 1600      Response to Exercise   Blood Pressure (Admit) 140/80   Blood Pressure (Exercise) 170/80   Blood Pressure (Exit) 134/78   Heart Rate (Admit) 78 bpm   Heart Rate (Exercise) 125 bpm   Heart Rate (Exit) 111 bpm   Rating of Perceived Exertion (Exercise) 12   Duration Progress to 45 minutes of aerobic exercise without signs/symptoms of physical distress   Intensity THRR unchanged     Progression   Progression Continue to progress workloads to maintain intensity without signs/symptoms of physical distress.   Average METs 4.8     Resistance Training   Training Prescription Yes    Weight 10   Reps 10-15     Treadmill   MPH 3.2   Grade 5   Minutes 10   METs 5.66     Bike   Level 2   Minutes 10   METs 5.01     NuStep   Level 4   Minutes 10   METs 4.2     Home Exercise Plan   Plans to continue exercise at Home (comment)   Frequency Add 4 additional days to program exercise sessions.      Nutrition:  Target Goals: Understanding of nutrition guidelines, daily intake of sodium 1500mg , cholesterol 200mg , calories 30% from fat and 7% or less from saturated fats, daily to have 5 or more servings of fruits and vegetables.  Biometrics:     Pre Biometrics - 11/20/16 1129      Pre Biometrics   Height 5' 10.5" (1.791 m)   Weight 206 lb 2.1 oz (93.5 kg)   Waist Circumference 41.75 inches   Hip Circumference 42.5 inches   Waist to Hip Ratio 0.98 %   BMI (Calculated) 29.2   Triceps Skinfold 12 mm   % Body Fat 27.4 %   Grip Strength 53.5 kg   Flexibility 16 in       Nutrition Therapy Plan and Nutrition Goals:   Nutrition Discharge: Nutrition Scores:   Nutrition Goals Re-Evaluation:   Nutrition Goals Re-Evaluation:   Nutrition Goals Discharge (Final Nutrition Goals Re-Evaluation):   Psychosocial: Target Goals: Acknowledge presence or absence of significant depression and/or stress, maximize coping skills, provide positive support system. Participant is able to verbalize types and ability to use techniques and skills needed for reducing stress and depression.  Initial Review & Psychosocial Screening:     Initial Psych Review & Screening - 11/12/16 [place]? Yes   Comments --  brief psychosocial assesment reveals no further intervention needed  at this time.     Barriers   Psychosocial barriers to participate in program There are no identifiable barriers or psychosocial needs.     Screening Interventions   Interventions Encouraged to exercise      Quality of Life Scores:     Quality of  Life - 11/12/16 1628      Quality of Life Scores   Health/Function Pre 22.13 %   Socioeconomic Pre 15.23 %   Psych/Spiritual Pre 30 %  Family Pre 28.29 %   GLOBAL Pre 20.4 %      PHQ-9: Recent Review Flowsheet Data    Depression screen [facility] 2/9 11/20/2016   Decreased Interest 0   Down, Depressed, Hopeless 0   PHQ - 2 Score 0     Interpretation of Total Score  Total Score Depression Severity:  1-4 = Minimal depression, 5-9 = Mild depression, 10-14 = Moderate depression, 15-19 = Moderately severe depression, 20-27 = Severe depression   Psychosocial Evaluation and Intervention:   Psychosocial Re-Evaluation:     Psychosocial Re-Evaluation    [place] Name 11/26/16 1636 12/25/16 1807 01/22/17 1436         Psychosocial Re-Evaluation   Current issues with  -  - None Identified     Interventions Encouraged to attend Cardiac Rehabilitation for the exercise Encouraged to attend Cardiac Rehabilitation for the exercise Encouraged to attend Cardiac Rehabilitation for the exercise     Continue Psychosocial Services  No No No Follow up required        Psychosocial Discharge (Final Psychosocial Re-Evaluation):     Psychosocial Re-Evaluation - 01/22/17 1436      Psychosocial Re-Evaluation   Current issues with None Identified   Interventions Encouraged to attend Cardiac Rehabilitation for the exercise   Continue Psychosocial Services  No Follow up required      Vocational Rehabilitation: Provide vocational rehab assistance to qualifying candidates.   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 Participant will state  understanding/return demonstration of topics presented.  Learning Barriers/Preferences:     Learning Barriers/Preferences - 11/12/16 1350      Learning Barriers/Preferences   Learning Barriers Sight   Learning Preferences Video;Pictoral;Skilled Demonstration;Written Material;Computer/Internet      Education Topics: Count Your Pulse:  -Group instruction provided by verbal instruction, demonstration, patient participation and written materials to support subject.  Instructors address importance of being able to find your pulse and how to count your pulse when at home without a heart monitor.  Patients get hands on experience counting their pulse with staff help and individually.   Heart Attack, Angina, and Risk Factor Modification:  -Group instruction provided by verbal instruction, video, and written materials to support subject.  Instructors address signs and symptoms of angina and heart attacks.    Also discuss risk factors for heart disease and how to make changes to improve heart health risk factors. Flowsheet Row CARDIAC REHAB PHASE II EXERCISE from 01/03/2017 in [place]  Date  12/18/16  Instruction Review Code  2- meets goals/outcomes      Functional Fitness:  -Group instruction provided by verbal instruction, demonstration, patient participation, and written materials to support subject.  Instructors address safety measures for doing things around the house.  Discuss how to get up and down off the floor, how to pick things up properly, how to safely get out of a chair without assistance, and balance training.   Meditation and Mindfulness:  -Group instruction provided by verbal instruction, patient participation, and written materials to support subject.  Instructor addresses importance of mindfulness and meditation practice to help reduce stress  and improve awareness.  Instructor also leads participants through a meditation exercise.     Stretching for Flexibility and Mobility:  -Group instruction provided by verbal instruction, patient participation, and written materials to support subject.  Instructors lead participants through series of stretches that are designed to increase flexibility thus improving mobility.  These stretches are additional exercise for major muscle groups that are typically performed during regular warm up and cool down.   Hands Only CPR Anytime:  -Group instruction provided by verbal instruction, video, patient participation and written materials to support subject.  Instructors co-teach with AHA video for hands only CPR.  Participants get hands on experience with mannequins.   Nutrition I class: Heart Healthy Eating:  -Group instruction provided by PowerPoint slides, verbal discussion, and written materials to support subject matter. The instructor gives an explanation and review of the Therapeutic Lifestyle Changes diet recommendations, which includes a discussion on lipid goals, dietary fat, sodium, fiber, plant stanol/sterol esters, sugar, and the components of a well-balanced, healthy diet. Flowsheet Row CARDIAC REHAB PHASE II EXERCISE from 01/03/2017 in [place]  Date  12/27/16  Educator  RD  Instruction Review Code  Not applicable [class handouts given]      Nutrition II class: Lifestyle Skills:  -Group instruction provided by PowerPoint slides, verbal discussion, and written materials to support subject matter. The instructor gives an explanation and review of label reading, grocery shopping for heart health, heart healthy recipe modifications, and ways to make healthier choices when eating out. Flowsheet Row CARDIAC REHAB PHASE II EXERCISE from 01/03/2017 in [place]  Date  01/03/17  Educator  RD  Instruction Review Code  Not applicable [class handouts given]      Diabetes Question & Answer:  -Group instruction provided  by PowerPoint slides, verbal discussion, and written materials to support subject matter. The instructor gives an explanation and review of diabetes co-morbidities, pre- and post-prandial blood glucose goals, pre-exercise blood glucose goals, signs, symptoms, and treatment of hypoglycemia and hyperglycemia, and foot care basics.   Diabetes Blitz:  -Group instruction provided by PowerPoint slides, verbal discussion, and written materials to support subject matter. The instructor gives an explanation and review of the physiology behind type 1 and type 2 diabetes, diabetes medications and rational behind using different medications, pre- and post-prandial blood glucose recommendations and Hemoglobin A1c goals, diabetes diet, and exercise including blood glucose guidelines for exercising safely.    Portion Distortion:  -Group instruction provided by PowerPoint slides, verbal discussion, written materials, and food models to support subject matter. The instructor gives an explanation of serving size versus portion size, changes in portions sizes over the last 20 years, and what consists of a serving from each food group.   Stress Management:  -Group instruction provided by verbal instruction, video, and written materials to support subject matter.  Instructors review role of stress in heart disease and how to cope with stress positively.   Flowsheet Row CARDIAC REHAB PHASE II EXERCISE from 01/03/2017 in [place]  Date  12/13/16  Instruction Review Code  2- meets goals/outcomes      Exercising on Your Own:  -Group instruction provided by verbal instruction, power point, and written materials to support subject.  Instructors discuss benefits of exercise, components of exercise, frequency and intensity of exercise, and end points for exercise.  Also discuss use of nitroglycerin and activating EMS.  Review options of places to exercise outside of  rehab.  Review guidelines  for sex with heart disease.   Cardiac Drugs I:  -Group instruction provided by verbal instruction and written materials to support subject.  Instructor reviews cardiac drug classes: antiplatelets, anticoagulants, beta blockers, and statins.  Instructor discusses reasons, side effects, and lifestyle considerations for each drug class.   Cardiac Drugs II:  -Group instruction provided by verbal instruction and written materials to support subject.  Instructor reviews cardiac drug classes: angiotensin converting enzyme inhibitors (ACE-I), angiotensin II receptor blockers (ARBs), nitrates, and calcium channel blockers.  Instructor discusses reasons, side effects, and lifestyle considerations for each drug class.   Anatomy and Physiology of the Circulatory System:  -Group instruction provided by verbal instruction, video, and written materials to support subject.  Reviews functional anatomy of heart, how it relates to various diagnoses, and what role the heart plays in the overall system.   Knowledge Questionnaire Score:     Knowledge Questionnaire Score - 11/12/16 1401      Knowledge Questionnaire Score   Pre Score 21/24      Core Components/Risk Factors/Patient Goals at Admission:     Personal Goals and Risk Factors at Admission - 11/12/16 1347      Core Components/Risk Factors/Patient Goals on Admission   Lipids Yes   Intervention Provide education and support for participant on nutrition & aerobic/resistive exercise along with prescribed medications to achieve LDL 70mg , HDL >40mg .   Expected Outcomes Short Term: Participant states understanding of desired cholesterol values and is compliant with medications prescribed. Participant is following exercise prescription and nutrition guidelines.;Long Term: Cholesterol controlled with medications as prescribed, with individualized exercise RX and with personalized nutrition plan. Value goals: LDL < 70mg , HDL > 40 mg.   Stress Yes    Intervention Offer individual and/or small group education and counseling on adjustment to heart disease, stress management and health-related lifestyle change. Teach and support self-help strategies.;Refer participants experiencing significant psychosocial distress to appropriate mental health specialists for further evaluation and treatment. When possible, include family members and significant others in education/counseling sessions.   Expected Outcomes Short Term: Participant demonstrates changes in health-related behavior, relaxation and other stress management skills, ability to obtain effective social support, and compliance with psychotropic medications if prescribed.;Long Term: Emotional wellbeing is indicated by absence of clinically significant psychosocial distress or social isolation.      Core Components/Risk Factors/Patient Goals Review:      Goals and Risk Factor Review    Row Name 12/23/16 1427             Core Components/Risk Factors/Patient Goals Review   Personal Goals Review Increase Strength and Stamina       Review Pt is currently exercising at [employer] everyday (walking 45 minutes/day on treadmill).  He is anticipating passing his stress test this summer in order to return to work        Expected Outcomes Continue with exercise routine in order to improve cardiorespiratory fitness           Core Components/Risk Factors/Patient Goals at Discharge (Final Review):      Goals and Risk Factor Review - 12/23/16 1427      Core Components/Risk Factors/Patient Goals Review   Personal Goals Review Increase Strength and Stamina   Review Pt is currently exercising at [employer] everyday (walking 45 minutes/day on treadmill).  He is anticipating passing his stress test this summer in order to return to work    Expected Outcomes Continue with exercise routine in order to  improve cardiorespiratory fitness       ITP Comments:     ITP Comments    Row Name 11/12/16 1333            ITP Comments Medical Director- Dr. Fransico Him, MD.          Comments: [name] is making expected progress toward personal goals after completing 14 sessions. Recommend continued exercise and life style modification education including  stress management and relaxation techniques to decrease cardiac risk profile. [name] is doing weill with exercise and goes to the gym on his non cardiac rehab days. Frank's radial vein harvest site has healed nicely.Barnet Pall, RN,BSN 01/23/2017 1:54 PM

## 2017-01-24 ENCOUNTER — Encounter (HOSPITAL_COMMUNITY)
Admission: RE | Admit: 2017-01-24 | Discharge: 2017-01-24 | Disposition: A | Discharge status: Home / Self Care | Payer: BLUE CROSS/BLUE SHIELD | Source: Ambulatory Visit | Attending: Internal Medicine | Admitting: Internal Medicine

## 2017-01-24 ENCOUNTER — Encounter: Payer: Self-pay | Admitting: Internal Medicine

## 2017-01-24 DIAGNOSIS — Z951 Presence of aortocoronary bypass graft: Secondary | ICD-10-CM | POA: Insufficient documentation

## 2017-01-24 NOTE — Telephone Encounter (Signed)
This encounter was created in error - please disregard.

## 2017-01-24 NOTE — Telephone Encounter (Signed)
-----   Message from Meta Hatchet sent at 01/20/2017  8:30 AM EST ----- Regarding: Aggie Moats Hi Dr. Harrington Challenger, Joseph Jimenez is doing very well in cardiac rehab and he's expressed interest in doing some resistance training.  With your permission can we incorporate resistance training in his exercise prescription?  If so do you have any parameters or restrictions for him?  Thanks, Cleda Mccreedy 01/20/2017 8:33

## 2017-01-27 ENCOUNTER — Encounter (HOSPITAL_COMMUNITY): Payer: BLUE CROSS/BLUE SHIELD

## 2017-01-29 ENCOUNTER — Encounter (HOSPITAL_COMMUNITY): Payer: BLUE CROSS/BLUE SHIELD

## 2017-01-31 ENCOUNTER — Ambulatory Visit: Payer: BLUE CROSS/BLUE SHIELD | Admitting: Internal Medicine

## 2017-01-31 ENCOUNTER — Encounter (HOSPITAL_COMMUNITY): Payer: BLUE CROSS/BLUE SHIELD

## 2017-02-03 ENCOUNTER — Encounter (HOSPITAL_COMMUNITY): Payer: BLUE CROSS/BLUE SHIELD

## 2017-02-03 ENCOUNTER — Telehealth (HOSPITAL_COMMUNITY): Payer: Self-pay | Admitting: *Deleted

## 2017-02-04 ENCOUNTER — Encounter (HOSPITAL_COMMUNITY): Payer: Self-pay | Admitting: *Deleted

## 2017-02-04 ENCOUNTER — Ambulatory Visit (INDEPENDENT_AMBULATORY_CARE_PROVIDER_SITE_OTHER): Payer: BLUE CROSS/BLUE SHIELD | Admitting: Physician Assistant

## 2017-02-04 ENCOUNTER — Telehealth (HOSPITAL_COMMUNITY): Payer: Self-pay | Admitting: General Practice

## 2017-02-04 ENCOUNTER — Encounter: Payer: Self-pay | Admitting: Physician Assistant

## 2017-02-04 VITALS — BP 120/70 | HR 66 | Ht 71.0 in | Wt 206.4 lb

## 2017-02-04 DIAGNOSIS — I779 Disorder of arteries and arterioles, unspecified: Secondary | ICD-10-CM

## 2017-02-04 DIAGNOSIS — I2510 Atherosclerotic heart disease of native coronary artery without angina pectoris: Secondary | ICD-10-CM | POA: Diagnosis not present

## 2017-02-04 DIAGNOSIS — Z951 Presence of aortocoronary bypass graft: Secondary | ICD-10-CM

## 2017-02-04 DIAGNOSIS — E785 Hyperlipidemia, unspecified: Secondary | ICD-10-CM | POA: Diagnosis not present

## 2017-02-04 DIAGNOSIS — I739 Peripheral vascular disease, unspecified: Secondary | ICD-10-CM

## 2017-02-04 NOTE — Patient Instructions (Addendum)
Medication Instructions:  Your physician has recommended you make the following change in your medication:Start Aspirin 81mg  daily once your aspirin 325mg  has run out.  Labwork: None ordered   Testing/Procedures: None ordered  Follow-Up: Your physician recommends that you schedule a follow-up appointment in: Please follow up with Dr. Harrington Challenger 03/28/2017 at 10:00 am  Any Other Special Instructions Will Be Listed Below (If Applicable).  If you need a refill on your cardiac medications before your next appointment, please call your pharmacy.

## 2017-02-04 NOTE — Progress Notes (Signed)
Cardiology Office Note:    Date:  02/04/2017   ID:  Joseph Jimenez, DOB 02-06-1956, MRN [id number]  PCP:  Suzi Roots, MD  Cardiologist:  Dr. Dorris Carnes   Electrophysiologist:  n/a  Referring MD: Suzi Roots, MD   Chief Complaint  Patient presents with  . Follow-up    CAD    History of Present Illness:    Joseph Jimenez is a 61 y.o. male with a hx of CAD s/p CABG (L-LAD, S-Dx, L radial-OM, S-AM/LPDA) 09/2016.  He has been followed recently at Dr. Leonarda Salon office for infection of L radial harvest site.  When last seen 11/2016, the infection had resolved.    He returns for Cardiology follow up.  He is here alone.  Since last seen, he denies chest pain, shortness of breath, syncope, orthopnea, PND or significant pedal edema.   Prior CV studies:   The following studies were reviewed today:  Carotid US 10/09/16 Bilateral ICA 1-39   LHC 10/08/16 LAD proximal 95, mid 50, D1 ostial 90, D2 ostial 80 LCx proximal 40, mid 85, OM1 90 RCA proximal 90, mid 80 EF 55-65  ETT 10/08/16 Blood pressure demonstrated a normal response to exercise. Upsloping ST segment depression ST segment depression was noted during stress in the V5 and V6 leads, beginning at 6 minutes of stress, and returning to baseline after 1-5 minutes of recovery. Clinically negative, electrically positive for ischemia at low HR Pt with signif arrhythmia (PVCs, NSVT) that increased with exercise Test stopped due to arrhythmias  Past Medical History:  Diagnosis Date  . Carotid artery disease ([place])    Carotid US 11/17:Bilateral ICA 1-39  . Coronary artery disease    a. LHC 11/17 with 3 vessel CAD, EF 55-65% // b. Status post CABG with Dr. Roxan Hockey 11/17 L-LAD, S-Dx, L radial-OM, S-AM/LPDA  . Heart murmur    "I've had it my whole life" (10/08/2016)  . High cholesterol   . History of kidney stones    "passed it" (10/08/2016)  . Prostate cancer ([place]) 2005    Past Surgical History:  Procedure  Laterality Date  . CARDIAC CATHETERIZATION  10/08/2016  . CARDIAC CATHETERIZATION N/A 10/08/2016   Procedure: Left Heart Cath and Coronary Angiography;  Surgeon: Belva Crome, MD;  Location: [facility];  Service: Cardiovascular;  Laterality: N/A;  . COLONOSCOPY WITH PROPOFOL N/A 01/29/2016   Procedure: COLONOSCOPY WITH PROPOFOL;  Surgeon: Garlan Fair, MD;  Location: [facility];  Service: Endoscopy;  Laterality: N/A;  . CORONARY ARTERY BYPASS GRAFT N/A 10/10/2016   Procedure: CORONARY ARTERY BYPASS GRAFTING (CABG);  Surgeon: Melrose Nakayama, MD;  Location: [facility];  Service: Open Heart Surgery;  Laterality: N/A;  Time 5  using left internal mammary artery, endoscopically harvested right saphenous vein, and left radial artery.  Consuela Mimes N/A 10/10/2016   Procedure: CYSTOSCOPY;  Surgeon: Melrose Nakayama, MD;  Location: [facility];  Service: Open Heart Surgery;  Laterality: N/A;  . INGUINAL HERNIA REPAIR Bilateral 1958   "before I was 60 months old ="  . PROSTATE BIOPSY  2005  . PROSTATECTOMY  2005  . TEE WITHOUT CARDIOVERSION N/A 10/10/2016   Procedure: TRANSESOPHAGEAL ECHOCARDIOGRAM (TEE);  Surgeon: Melrose Nakayama, MD;  Location: [facility];  Service: Open Heart Surgery;  Laterality: N/A;    Current Medications: Current Meds  Medication Sig  . aspirin EC 325 MG EC tablet Take 1 tablet (325 mg total) by mouth daily.  Marland Kitchen atorvastatin (LIPITOR) 80 MG  tablet Take 1 tablet (80 mg total) by mouth daily at 6 PM.  . ezetimibe (ZETIA) 10 MG tablet Take 1 tablet (10 mg total) by mouth daily.  . metoprolol succinate (TOPROL-XL) 25 MG 24 hr tablet Take 1 tablet (25 mg total) by mouth daily.  . Multiple Vitamin (MULTIVITAMIN) tablet Take 1 tablet by mouth daily.  . nitroGLYCERIN (NITROSTAT) 0.4 MG SL tablet Place 0.4 mg under the tongue every 5 (five) minutes as needed for chest pain.     Allergies:   Patient has no known allergies.   Social History   Social History  . Marital  status: Married    Spouse name: N/A  . Number of children: N/A  . Years of education: N/A   Social History Main Topics  . Smoking status: Former Smoker    Packs/day: 1.00    Years: 20.00    Types: Cigarettes    Quit date: 09/15/1998  . Smokeless tobacco: Never Used  . Alcohol use Yes     Comment: "quit drinking 09/15/1998"  . Drug use: Yes     Comment: 10/08/2016 "experimented in college; none since"  . Sexual activity: Yes   Other Topics Concern  . None   Social History Narrative  . None     Family History  Problem Relation Age of Onset  . CAD Mother   . Obesity Brother      ROS:   Please see the history of present illness.    ROS All other systems reviewed and are negative.   EKGs/Labs/Other Test Reviewed:    EKG:  EKG is  ordered today.  The ekg ordered today demonstrates NSR, HR 66, lead reversal, QTc 408 ms  Recent Labs: 10/11/2016: Magnesium 2.3 10/13/2016: BUN 11; Creatinine, Ser 0.87; Hemoglobin 11.3; Platelets 151; Potassium 3.8; Sodium 138 12/30/2016: ALT 16   Recent Lipid Panel    Component Value Date/Time   CHOL 154 12/30/2016 0805   TRIG 95 12/30/2016 0805   HDL 51 12/30/2016 0805   CHOLHDL 3.0 12/30/2016 0805   CHOLHDL 4.3 10/07/2016 0816   VLDL 21 10/07/2016 0816   LDLCALC 84 12/30/2016 0805     Physical Exam:    VS:  BP 120/70   Pulse 66   Ht 5\' 11"  (1.803 m)   Wt 206 lb 6.4 oz (93.6 kg)   BMI 28.79 kg/m     Wt Readings from Last 3 Encounters:  02/04/17 206 lb 6.4 oz (93.6 kg)  12/09/16 200 lb (90.7 kg)  12/02/16 200 lb (90.7 kg)     Physical Exam  Constitutional: He is oriented to person, place, and time. He appears well-developed and well-nourished. No distress.  HENT:  Head: Normocephalic and atraumatic.  Eyes: No scleral icterus.  Neck: No JVD present.  Cardiovascular: Normal rate, regular rhythm and normal heart sounds.   No murmur heard. Pulmonary/Chest: He has no wheezes. He has no rales.  Abdominal: There is no  tenderness.  Musculoskeletal: He exhibits no edema.  Neurological: He is alert and oriented to person, place, and time.  Skin: Skin is warm and dry.  Psychiatric: He has a normal mood and affect.    ASSESSMENT:    1. Coronary artery disease involving native coronary artery of native heart without angina pectoris   2. Hyperlipidemia, unspecified hyperlipidemia type   3. Bilateral carotid artery disease ([place])    PLAN:    In order of problems listed above:  1. Coronary artery disease involving native coronary artery of  native heart without angina pectoris -  No angina.  He is back to regular activity.  He just got back from a trip to [place].  He feels great.  He will need a repeat cardiac cath and stress nuclear study once he is 6 mos out from his CABG in order to be cleared to fly by the [facility].  I will have him FU with Dr. Dorris Carnes in May (this will be 6 mos from his CABG).  Continue ASA.  This can be changed to 81 mg QD.   Continue beta blocker, statin.   2. Hyperlipidemia, unspecified hyperlipidemia type -  LDL in 2/18 was 80.  He is now on Zetia and Lipitor 80.  Repeat Lipids planned in 4/18.  3. Bilateral carotid artery disease ([facility]) -  Minimal plaque on pre-op dopplers.  Consider FU Carotid US in 09/2018.   Dispo:  Return in about 7 weeks (around 03/28/2017) for Routine Follow Up, w/ Dr. Harrington Challenger.    Medication Adjustments/Labs and Tests Ordered: Current medicines are reviewed at length with the patient today.  Concerns regarding medicines are outlined above.  Medication changes, Labs and Tests ordered today are outlined in the Patient Instructions noted below. Patient Instructions  Medication Instructions:  Your physician has recommended you make the following change in your medication:Start Aspirin 81mg  daily once your aspirin 325mg  has run out.  Labwork: None ordered   Testing/Procedures: None ordered  Follow-Up: Your physician recommends that you schedule a follow-up  appointment in: Please follow up with Dr. Harrington Challenger 03/28/2017 at 10:00 am  Any Other Special Instructions Will Be Listed Below (If Applicable).  If you need a refill on your cardiac medications before your next appointment, please call your pharmacy.  Signed, Richardson Dopp, PA-C  02/04/2017 4:04 PM    [facility] [address], [place], [place]  [address] Phone: [phone]; Fax: [phone]

## 2017-02-04 NOTE — Progress Notes (Signed)
Discharge Summary  Patient Details  Name: Joseph Jimenez MRN: [id number] Date of Birth: [birth year] Referring Provider:     CARDIAC REHAB PHASE II EXERCISE from 11/20/2016 in [facility]  Referring Provider  Dorris Carnes MD       Number of Visits: 14  Reason for Discharge:  Patient independent in their exercise.  Smoking History:  History  Smoking Status  . Former Smoker  . Packs/day: 1.00  . Years: 20.00  . Types: Cigarettes  . Quit date: 09/15/1998  Smokeless Tobacco  . Never Used    Diagnosis:  S/P CABG x 5  ADL UCSD:   Initial Exercise Prescription:   Discharge Exercise Prescription (Final Exercise Prescription Changes):     Exercise Prescription Changes - 02/05/17 1600      Response to Exercise   Blood Pressure (Admit) 122/62   Blood Pressure (Exercise) 150/84   Blood Pressure (Exit) 120/80   Heart Rate (Admit) 71 bpm   Heart Rate (Exercise) 111 bpm   Heart Rate (Exit) 73 bpm   Rating of Perceived Exertion (Exercise) 12   Duration Progress to 45 minutes of aerobic exercise without signs/symptoms of physical distress   Intensity THRR unchanged     Progression   Progression Continue to progress workloads to maintain intensity without signs/symptoms of physical distress.   Average METs 5     Resistance Training   Training Prescription Yes   Weight 10   Reps 10-15     Treadmill   MPH 3.2   Grade 5   Minutes 10   METs 5.66     Bike   Level 2   Minutes 10   METs 5.01     NuStep   Level 4   Minutes 10   METs 4.2     Home Exercise Plan   Plans to continue exercise at Home (comment)   Frequency Add 4 additional days to program exercise sessions.      Functional Capacity:   Psychological, QOL, Others - Outcomes: PHQ 2/9: Depression screen PHQ 2/9 11/20/2016  Decreased Interest 0  Down, Depressed, Hopeless 0  PHQ - 2 Score 0    Quality of Life:   Personal Goals: Goals established at  orientation with interventions provided to work toward goal.    Personal Goals Discharge:     Goals and Risk Factor Review    Row Name 12/23/16 1427             Core Components/Risk Factors/Patient Goals Review   Personal Goals Review Increase Strength and Stamina       Review Pt is currently exercising at [employer] everyday (walking 45 minutes/day on treadmill).  He is anticipating passing his stress test this summer in order to return to work        Expected Outcomes Continue with exercise routine in order to improve cardiorespiratory fitness           Nutrition & Weight - Outcomes:    Nutrition:   Nutrition Discharge:   Education Questionnaire Score:   [name] graduated from cardiac rehab program  with completion of 14 exercise sessions in Phase II. Pt maintained fair attendance and progressed nicely during his participation in rehab as evidenced by increased MET level..  Pt has made significant lifestyle changes and should be commended for his success. Pt feels he has achieved his goals during cardiac rehab. [name]'s last day of exercise was on 01/20/2017. [name] is exercising on his own and  has been discharged from the [place], RN,BSN 02/07/2017 9:39 AM

## 2017-02-05 ENCOUNTER — Encounter (HOSPITAL_COMMUNITY): Payer: BLUE CROSS/BLUE SHIELD

## 2017-02-07 ENCOUNTER — Encounter (HOSPITAL_COMMUNITY): Payer: BLUE CROSS/BLUE SHIELD

## 2017-02-10 ENCOUNTER — Encounter (HOSPITAL_COMMUNITY): Payer: BLUE CROSS/BLUE SHIELD

## 2017-02-12 ENCOUNTER — Encounter (HOSPITAL_COMMUNITY): Payer: BLUE CROSS/BLUE SHIELD

## 2017-02-14 ENCOUNTER — Encounter (HOSPITAL_COMMUNITY): Payer: BLUE CROSS/BLUE SHIELD

## 2017-02-17 ENCOUNTER — Encounter (HOSPITAL_COMMUNITY): Payer: BLUE CROSS/BLUE SHIELD

## 2017-02-19 ENCOUNTER — Encounter (HOSPITAL_COMMUNITY): Payer: BLUE CROSS/BLUE SHIELD

## 2017-02-21 ENCOUNTER — Encounter (HOSPITAL_COMMUNITY): Payer: BLUE CROSS/BLUE SHIELD

## 2017-02-24 ENCOUNTER — Encounter (HOSPITAL_COMMUNITY): Payer: BLUE CROSS/BLUE SHIELD

## 2017-02-26 ENCOUNTER — Encounter (HOSPITAL_COMMUNITY): Payer: BLUE CROSS/BLUE SHIELD

## 2017-02-28 ENCOUNTER — Encounter (HOSPITAL_COMMUNITY): Payer: BLUE CROSS/BLUE SHIELD

## 2017-03-03 ENCOUNTER — Other Ambulatory Visit: Payer: BLUE CROSS/BLUE SHIELD | Admitting: *Deleted

## 2017-03-03 ENCOUNTER — Encounter (HOSPITAL_COMMUNITY): Payer: BLUE CROSS/BLUE SHIELD

## 2017-03-03 DIAGNOSIS — I2510 Atherosclerotic heart disease of native coronary artery without angina pectoris: Secondary | ICD-10-CM | POA: Diagnosis not present

## 2017-03-03 DIAGNOSIS — E785 Hyperlipidemia, unspecified: Secondary | ICD-10-CM

## 2017-03-03 LAB — LIPID PANEL
Chol/HDL Ratio: 2.4 ratio (ref 0.0–5.0)
Cholesterol, Total: 121 mg/dL (ref 100–199)
HDL: 51 mg/dL (ref >39)
LDL Calculated: 54 mg/dL (ref 0–99)
TRIGLYCERIDES: 79 mg/dL (ref 0–149)
VLDL Cholesterol Cal: 16 mg/dL (ref 5–40)

## 2017-03-03 LAB — AST: AST: 24 IU/L (ref 0–40)

## 2017-03-05 ENCOUNTER — Telehealth: Payer: Self-pay | Admitting: Internal Medicine

## 2017-03-05 ENCOUNTER — Encounter (HOSPITAL_COMMUNITY): Payer: BLUE CROSS/BLUE SHIELD

## 2017-03-05 DIAGNOSIS — I2510 Atherosclerotic heart disease of native coronary artery without angina pectoris: Secondary | ICD-10-CM

## 2017-03-05 NOTE — Telephone Encounter (Signed)
Left VM on phone   For [place] pt requires 1  Stress nuclear study and 2 L heart catheterization to be performed 6 months from CABG    WIll need to be scheduled  Please arrange cath with Linard Millers if possible who did cath before

## 2017-03-07 ENCOUNTER — Encounter (HOSPITAL_COMMUNITY): Payer: BLUE CROSS/BLUE SHIELD

## 2017-03-10 ENCOUNTER — Encounter (HOSPITAL_COMMUNITY): Payer: BLUE CROSS/BLUE SHIELD

## 2017-03-12 ENCOUNTER — Encounter (HOSPITAL_COMMUNITY): Payer: BLUE CROSS/BLUE SHIELD

## 2017-03-14 ENCOUNTER — Encounter (HOSPITAL_COMMUNITY): Payer: BLUE CROSS/BLUE SHIELD

## 2017-03-17 ENCOUNTER — Encounter (HOSPITAL_COMMUNITY): Payer: BLUE CROSS/BLUE SHIELD

## 2017-03-19 ENCOUNTER — Encounter (HOSPITAL_COMMUNITY): Payer: BLUE CROSS/BLUE SHIELD

## 2017-03-20 NOTE — Addendum Note (Signed)
Addended by: Rodman Key on: 03/20/2017 03:51 PM   Modules accepted: Orders

## 2017-03-20 NOTE — Telephone Encounter (Signed)
CABG was 10/10/16.  Called patient to discuss/set up heart catheterization and myoview.  Left message for patient to call back.  Pt will need labs and an OV prior to cath

## 2017-03-20 NOTE — Addendum Note (Signed)
Addended by: Rodman Key on: 03/20/2017 02:30 PM   Modules accepted: Orders

## 2017-03-20 NOTE — Telephone Encounter (Signed)
Patient will have cath with Dr. Tamala Julian on 04/15/17. [facility] has scheduled myoview for 5/17.  He can have pre cath labs done at that time.  Pt has appointment with Dr. Harrington Challenger on 03/28/17.  Sent message to billing to check on coverage.  Will send all instructions through patient portal per patient's request.

## 2017-03-28 ENCOUNTER — Encounter: Payer: Self-pay | Admitting: *Deleted

## 2017-03-28 ENCOUNTER — Encounter: Payer: Self-pay | Admitting: Internal Medicine

## 2017-03-28 ENCOUNTER — Ambulatory Visit (INDEPENDENT_AMBULATORY_CARE_PROVIDER_SITE_OTHER): Payer: BLUE CROSS/BLUE SHIELD | Admitting: Internal Medicine

## 2017-03-28 VITALS — BP 142/86 | HR 66 | Ht 71.0 in | Wt 203.4 lb

## 2017-03-28 DIAGNOSIS — E782 Mixed hyperlipidemia: Secondary | ICD-10-CM | POA: Diagnosis not present

## 2017-03-28 DIAGNOSIS — I10 Essential (primary) hypertension: Secondary | ICD-10-CM | POA: Diagnosis not present

## 2017-03-28 DIAGNOSIS — I2510 Atherosclerotic heart disease of native coronary artery without angina pectoris: Secondary | ICD-10-CM | POA: Diagnosis not present

## 2017-03-28 MED ORDER — ASPIRIN 325 MG PO TBEC: 325.0000 mg | DELAYED_RELEASE_TABLET | Freq: Every day | ORAL | 0 refills | Status: AC

## 2017-03-28 MED ORDER — ASPIRIN EC 81 MG PO TBEC: 81.0000 mg | DELAYED_RELEASE_TABLET | Freq: Every day | ORAL | 3 refills | Status: AC

## 2017-03-28 NOTE — Patient Instructions (Addendum)
Your physician recommends that you continue on your current medications as directed. Please refer to the Current Medication list given to you today.    For stress test on 5/17: 1.) hold metoprolol succinate for 24 hours prior to test (do not take on 5/16) 2.) no eating or drinking except water for 2 hours prior to the test 3.) no caffeine, decaf or chocolate for 12 hours prior   Stop aspirin 325 mg on 04/24/17. Start aspirin 81 mg on 04/25/17.

## 2017-03-28 NOTE — Progress Notes (Addendum)
Cardiology Office Note:    Date:  05/08/2017   ID:  Joseph Jimenez, DOB 04-17-1956, MRN [id number]  PCP:  Patient, No Pcp Per  Cardiologist:  Dr. Dorris Carnes   Electrophysiologist:  n/a  Referring MD: Suzi Roots, MD   F/U of CAD    History of Present Illness:    Joseph Jimenez is a 61 y.o. male with a hx of CAD s/p CABG (L-LAD, S-Dx, L radial-OM, S-AM/LPDA) 09/2016.   Since bypass surgery he has been seen by Joseph Jimenez and Joseph Jimenez  Since last in clinic he has done well  Breathing is good  No CP  Active  Playing paddle tennis  He is not having any symptoms like he did before CABG   Prior CV studies:   The following studies were reviewed today:  Carotid US 10/09/16 Bilateral ICA 1-39   LHC 10/08/16 LAD proximal 95, mid 50, D1 ostial 90, D2 ostial 80 LCx proximal 40, mid 85, OM1 90 RCA proximal 90, mid 80 EF 55-65  ETT 10/08/16 Blood pressure demonstrated a normal response to exercise. Upsloping ST segment depression ST segment depression was noted during stress in the V5 and V6 leads, beginning at 6 minutes of stress, and returning to baseline after 1-5 minutes of recovery. Clinically negative, electrically positive for ischemia at low HR Pt with signif arrhythmia (PVCs, NSVT) that increased with exercise Test stopped due to arrhythmias  Past Medical History:  Diagnosis Date  . Carotid artery disease ([place])    Carotid US 11/17:Bilateral ICA 1-39  . Coronary artery disease    a. LHC 11/17 with 3 vessel CAD, EF 55-65% // b. Status post CABG with Dr. Roxan Hockey 11/17 L-LAD, S-Dx, L radial-OM, S-AM/LPDA  . Heart murmur    "I've had it my whole life" (10/08/2016)  . High cholesterol   . History of kidney stones    "passed it" (10/08/2016)  . Prostate cancer ([place]) 2005    Past Surgical History:  Procedure Laterality Date  . CARDIAC CATHETERIZATION  10/08/2016  . CARDIAC CATHETERIZATION N/A 10/08/2016   Procedure: Left Heart Cath and Coronary Angiography;   Surgeon: Belva Crome, MD;  Location: [facility];  Service: Cardiovascular;  Laterality: N/A;  . COLONOSCOPY WITH PROPOFOL N/A 01/29/2016   Procedure: COLONOSCOPY WITH PROPOFOL;  Surgeon: Garlan Fair, MD;  [facility];  Service: Endoscopy;  Laterality: N/A;  . CORONARY ARTERY BYPASS GRAFT N/A 10/10/2016   Procedure: CORONARY ARTERY BYPASS GRAFTING (CABG);  Surgeon: Melrose Nakayama, MD;  Location: [facility];  Service: Open Heart Surgery;  Laterality: N/A;  Time 5  using left internal mammary artery, endoscopically harvested right saphenous vein, and left radial artery.  Consuela Mimes N/A 10/10/2016   Procedure: CYSTOSCOPY;  Surgeon: Melrose Nakayama, MD;  Location: [facility];  Service: Open Heart Surgery;  Laterality: N/A;  . INGUINAL HERNIA REPAIR Bilateral 1958   "before I was 60 months old ="  . LEFT HEART CATH AND CORS/GRAFTS ANGIOGRAPHY N/A 04/15/2017   Procedure: Left Heart Cath and Cors/Grafts Angiography;  Surgeon: Belva Crome, MD;  Location: [facility];  Service: Cardiovascular;  Laterality: N/A;  . PROSTATE BIOPSY  2005  . PROSTATECTOMY  2005  . TEE WITHOUT CARDIOVERSION N/A 10/10/2016   Procedure: TRANSESOPHAGEAL ECHOCARDIOGRAM (TEE);  Surgeon: Melrose Nakayama, MD;  Location: [facility];  Service: Open Heart Surgery;  Laterality: N/A;    Current Medications: Current Meds  Medication Sig  . [EXPIRED]  aspirin 325 MG EC tablet Take 1 tablet (325 mg total) by mouth daily.  Marland Kitchen atorvastatin (LIPITOR) 80 MG tablet Take 1 tablet (80 mg total) by mouth daily at 6 PM.  . ezetimibe (ZETIA) 10 MG tablet Take 1 tablet (10 mg total) by mouth daily.  . metoprolol succinate (TOPROL-XL) 25 MG 24 hr tablet Take 1 tablet (25 mg total) by mouth daily.  . Multiple Vitamin (MULTIVITAMIN) tablet Take 1 tablet by mouth daily.  . nitroGLYCERIN (NITROSTAT) 0.4 MG SL tablet Place 0.4 mg under the tongue every 5 (five) minutes as needed for chest pain.  . [DISCONTINUED]  aspirin EC 325 MG EC tablet Take 1 tablet (325 mg total) by mouth daily.     Allergies:   Patient has no known allergies.   Social History   Social History  . Marital status: Married    Spouse name: N/A  . Number of children: N/A  . Years of education: N/A   Social History Main Topics  . Smoking status: Former Smoker    Packs/day: 1.00    Years: 20.00    Types: Cigarettes    Quit date: 09/15/1998  . Smokeless tobacco: Never Used  . Alcohol use Yes     Comment: "quit drinking 09/15/1998"  . Drug use: Yes     Comment: 10/08/2016 "experimented in college; none since"  . Sexual activity: Yes   Other Topics Concern  . None   Social History Narrative  . None     Family History  Problem Relation Age of Onset  . CAD Mother   . Obesity Brother      ROS:   Please see the history of present illness.    ROS All other systems reviewed and are negative.   EKGs/Labs/Other Test Reviewed:    EKG:  EKG is not  ordered today.    Recent Labs: 10/11/2016: Magnesium 2.3 12/30/2016: ALT 16 04/10/2017: BUN 21; Creatinine, Ser 0.84; Hemoglobin 16.0; Platelets 198; Potassium 4.8; Sodium 138   Recent Lipid Panel    Component Value Date/Time   CHOL 121 03/03/2017 0837   TRIG 79 03/03/2017 0837   HDL 51 03/03/2017 0837   CHOLHDL 2.4 03/03/2017 0837   CHOLHDL 4.3 10/07/2016 0816   VLDL 21 10/07/2016 0816   LDLCALC 54 03/03/2017 0837     Physical Exam:    VS:  BP (!) 142/86   Pulse 66   Ht 5\' 11"  (1.803 m)   Wt 92.3 kg (203 lb 6.4 oz)   SpO2 98%   BMI 28.37 kg/m     Wt Readings from Last 3 Encounters:  04/15/17 90.7 kg (200 lb)  03/28/17 92.3 kg (203 lb 6.4 oz)  02/04/17 93.6 kg (206 lb 6.4 oz)     Physical Exam  Constitutional: He is oriented to person, place, and time. He appears well-developed and well-nourished. No distress.  HENT:  Head: Normocephalic and atraumatic.  Eyes: No scleral icterus.  Neck: No JVD present.  Cardiovascular: Normal rate, regular  rhythm and normal heart sounds.   No murmur heard. Pulmonary/Chest: He has no wheezes. He has no rales.  Abdominal: There is no tenderness.  Musculoskeletal: He exhibits no edema.  Neurological: He is alert and oriented to person, place, and time.  Skin: Skin is warm and dry.  Psychiatric: He has a normal mood and affect.     PLAN:    In order of problems listed above:  1. Coronary artery disease involving native coronary artery of native  heart without angina pectoris - Doing very good Very active  The pt wants to get back to work  For this he is required to have a nuclear GXT and cath  2. Hyperlipidemia, unspecified hyperlipidemia type -   Lipids are much better on statin and Zetia  Continue    3. Bilateral carotid artery disease ([facility]) -  Minimal plaque on pre-op dopplers.  Consider FU Carotid US in 09/2018.  4  HTN  BP is a little elevated  I encouraged him to check it at home  Should be in 110s to 120s      Dispo:  F/U in the fall     Medication Adjustments/Labs and Tests Ordered: Current medicines are reviewed at length with the patient today.  Concerns regarding medicines are outlined above.  Medication changes, Labs and Tests ordered today are outlined in the Patient Instructions noted below. Patient Instructions  Your physician recommends that you continue on your current medications as directed. Please refer to the Current Medication list given to you today.    For stress test on 5/17: 1.) hold metoprolol succinate for 24 hours prior to test (do not take on 5/16) 2.) no eating or drinking except water for 2 hours prior to the test 3.) no caffeine, decaf or chocolate for 12 hours prior   Stop aspirin 325 mg on 04/24/17. Start aspirin 81 mg on 04/25/17.  Signed, Dorris Carnes, MD  05/08/2017 9:34 AM    [facility], [place], [place]  [address] Phone: [phone]; Fax: [phone]

## 2017-04-07 ENCOUNTER — Telehealth (HOSPITAL_COMMUNITY): Payer: Self-pay | Admitting: *Deleted

## 2017-04-07 NOTE — Telephone Encounter (Signed)
Patient given detailed instructions per Myocardial Perfusion Study Information Sheet for the test on 04/10/17. Patient notified to arrive 15 minutes early and that it is imperative to arrive on time for appointment to keep from having the test rescheduled.  If you need to cancel or reschedule your appointment, please call the office within 24 hours of your appointment. Failure to do so may result in a cancellation of your appointment, and a $50 no show fee. Patient verbalized understanding. Kirstie Peri

## 2017-04-10 ENCOUNTER — Ambulatory Visit (HOSPITAL_COMMUNITY): Payer: BLUE CROSS/BLUE SHIELD | Attending: Cardiovascular Disease

## 2017-04-10 ENCOUNTER — Other Ambulatory Visit: Payer: BLUE CROSS/BLUE SHIELD

## 2017-04-10 DIAGNOSIS — I2510 Atherosclerotic heart disease of native coronary artery without angina pectoris: Secondary | ICD-10-CM

## 2017-04-10 DIAGNOSIS — R9439 Abnormal result of other cardiovascular function study: Secondary | ICD-10-CM | POA: Diagnosis not present

## 2017-04-10 DIAGNOSIS — E7849 Other hyperlipidemia: Secondary | ICD-10-CM

## 2017-04-10 DIAGNOSIS — I739 Peripheral vascular disease, unspecified: Secondary | ICD-10-CM

## 2017-04-10 DIAGNOSIS — I779 Disorder of arteries and arterioles, unspecified: Secondary | ICD-10-CM

## 2017-04-10 DIAGNOSIS — E784 Other hyperlipidemia: Secondary | ICD-10-CM | POA: Diagnosis not present

## 2017-04-10 LAB — CBC WITH DIFFERENTIAL/PLATELET
BASOS ABS: 0.1 10*3/uL (ref 0.0–0.2)
BASOS: 1 %
EOS (ABSOLUTE): 0.2 10*3/uL (ref 0.0–0.4)
Eos: 3 %
Hematocrit: 45.3 % (ref 37.5–51.0)
Hemoglobin: 16 g/dL (ref 13.0–17.7)
IMMATURE GRANS (ABS): 0 10*3/uL (ref 0.0–0.1)
IMMATURE GRANULOCYTES: 0 %
Lymphocytes Absolute: 1.8 10*3/uL (ref 0.7–3.1)
Lymphs: 20 %
MCH: 30 pg (ref 26.6–33.0)
MCHC: 35.3 g/dL (ref 31.5–35.7)
MCV: 85 fL (ref 79–97)
MONOS ABS: 0.5 10*3/uL (ref 0.1–0.9)
Monocytes: 6 %
NEUTROS PCT: 70 %
Neutrophils Absolute: 6.2 10*3/uL (ref 1.4–7.0)
PLATELETS: 198 10*3/uL (ref 150–379)
RBC: 5.33 x10E6/uL (ref 4.14–5.80)
RDW: 14.5 % (ref 12.3–15.4)
WBC: 8.8 10*3/uL (ref 3.4–10.8)

## 2017-04-10 LAB — BASIC METABOLIC PANEL
BUN/Creatinine Ratio: 25 — ABNORMAL HIGH (ref 10–24)
BUN: 21 mg/dL (ref 8–27)
CHLORIDE: 101 mmol/L (ref 96–106)
CO2: 20 mmol/L (ref 18–29)
Calcium: 9.1 mg/dL (ref 8.6–10.2)
Creatinine, Ser: 0.84 mg/dL (ref 0.76–1.27)
GFR calc Af Amer: 110 mL/min/{1.73_m2} (ref >59)
GFR calc non Af Amer: 95 mL/min/{1.73_m2} (ref >59)
GLUCOSE: 102 mg/dL — AB (ref 65–99)
POTASSIUM: 4.8 mmol/L (ref 3.5–5.2)
Sodium: 138 mmol/L (ref 134–144)

## 2017-04-10 LAB — MYOCARDIAL PERFUSION IMAGING
CHL CUP MPHR: 162 {beats}/min
CHL CUP NUCLEAR SDS: 3
CHL CUP NUCLEAR SRS: 1
CSEPPHR: 162 {beats}/min
Estimated workload: 14.2 METS
Exercise duration (min): 12 min
Exercise duration (sec): 30 s
LHR: 0.27
LV sys vol: 39 mL
LVDIAVOL: 87 mL (ref 62–150)
Percent HR: 101 %
Rest HR: 59 {beats}/min
SSS: 4
TID: 0.84

## 2017-04-10 LAB — PROTIME-INR
INR: 1 (ref 0.8–1.2)
Prothrombin Time: 10.9 s (ref 9.1–12.0)

## 2017-04-10 MED ORDER — TECHNETIUM TC 99M TETROFOSMIN IV KIT
31.6000 | PACK | Freq: Once | INTRAVENOUS | Status: AC | PRN
  Administered 2017-04-10: 31.6 via INTRAVENOUS
  Filled 2017-04-10: qty 32

## 2017-04-10 MED ORDER — TECHNETIUM TC 99M TETROFOSMIN IV KIT
10.7000 | PACK | Freq: Once | INTRAVENOUS | Status: AC | PRN
  Administered 2017-04-10: 10.7 via INTRAVENOUS
  Filled 2017-04-10: qty 11

## 2017-04-10 NOTE — Addendum Note (Signed)
Addended by: Marciano Sequin on: 04/10/2017 08:33 AM   Modules accepted: Orders

## 2017-04-15 ENCOUNTER — Encounter (HOSPITAL_COMMUNITY)
Admission: RE | Disposition: A | Discharge status: Home / Self Care | Payer: Self-pay | Source: Ambulatory Visit | Attending: Interventional Cardiology

## 2017-04-15 ENCOUNTER — Encounter (HOSPITAL_COMMUNITY): Payer: Self-pay | Admitting: Interventional Cardiology

## 2017-04-15 ENCOUNTER — Ambulatory Visit (HOSPITAL_COMMUNITY)
Admission: RE | Admit: 2017-04-15 | Discharge: 2017-04-15 | Disposition: A | Discharge status: Home / Self Care | Payer: BLUE CROSS/BLUE SHIELD | Source: Ambulatory Visit | Attending: Interventional Cardiology | Admitting: Interventional Cardiology

## 2017-04-15 DIAGNOSIS — Z8249 Family history of ischemic heart disease and other diseases of the circulatory system: Secondary | ICD-10-CM | POA: Insufficient documentation

## 2017-04-15 DIAGNOSIS — Z7982 Long term (current) use of aspirin: Secondary | ICD-10-CM | POA: Diagnosis not present

## 2017-04-15 DIAGNOSIS — E7849 Other hyperlipidemia: Secondary | ICD-10-CM | POA: Diagnosis present

## 2017-04-15 DIAGNOSIS — I10 Essential (primary) hypertension: Secondary | ICD-10-CM | POA: Insufficient documentation

## 2017-04-15 DIAGNOSIS — Z87891 Personal history of nicotine dependence: Secondary | ICD-10-CM | POA: Diagnosis not present

## 2017-04-15 DIAGNOSIS — Z951 Presence of aortocoronary bypass graft: Secondary | ICD-10-CM | POA: Diagnosis not present

## 2017-04-15 DIAGNOSIS — E7800 Pure hypercholesterolemia, unspecified: Secondary | ICD-10-CM | POA: Insufficient documentation

## 2017-04-15 DIAGNOSIS — I2510 Atherosclerotic heart disease of native coronary artery without angina pectoris: Secondary | ICD-10-CM | POA: Insufficient documentation

## 2017-04-15 DIAGNOSIS — I6523 Occlusion and stenosis of bilateral carotid arteries: Secondary | ICD-10-CM | POA: Diagnosis not present

## 2017-04-15 HISTORY — PX: LEFT HEART CATH AND CORS/GRAFTS ANGIOGRAPHY: CATH118250

## 2017-04-15 SURGERY — LEFT HEART CATH AND CORS/GRAFTS ANGIOGRAPHY
Anesthesia: LOCAL

## 2017-04-15 MED ORDER — MIDAZOLAM HCL 2 MG/2ML IJ SOLN
INTRAMUSCULAR | Status: DC | PRN
  Administered 2017-04-15 (×2): 1 mg via INTRAVENOUS

## 2017-04-15 MED ORDER — OXYCODONE-ACETAMINOPHEN 5-325 MG PO TABS: 1.0000 | ORAL_TABLET | ORAL | Status: DC | PRN

## 2017-04-15 MED ORDER — FENTANYL CITRATE (PF) 100 MCG/2ML IJ SOLN
INTRAMUSCULAR | Status: AC
  Filled 2017-04-15: qty 2

## 2017-04-15 MED ORDER — SODIUM CHLORIDE 0.9 % IV SOLN: INTRAVENOUS | Status: DC

## 2017-04-15 MED ORDER — IOPAMIDOL (ISOVUE-370) INJECTION 76%
INTRAVENOUS | Status: AC
  Filled 2017-04-15: qty 50

## 2017-04-15 MED ORDER — HEPARIN (PORCINE) IN NACL 2-0.9 UNIT/ML-% IJ SOLN
INTRAMUSCULAR | Status: AC | PRN
  Administered 2017-04-15: 1000 mL

## 2017-04-15 MED ORDER — MIDAZOLAM HCL 2 MG/2ML IJ SOLN
INTRAMUSCULAR | Status: AC
  Filled 2017-04-15: qty 2

## 2017-04-15 MED ORDER — SODIUM CHLORIDE 0.9 % IV SOLN: 250.0000 mL | INTRAVENOUS | Status: DC | PRN

## 2017-04-15 MED ORDER — LIDOCAINE HCL (PF) 1 % IJ SOLN
INTRAMUSCULAR | Status: DC | PRN
  Administered 2017-04-15: 15 mL

## 2017-04-15 MED ORDER — ONDANSETRON HCL 4 MG/2ML IJ SOLN: 4.0000 mg | Freq: Four times a day (QID) | INTRAMUSCULAR | Status: DC | PRN

## 2017-04-15 MED ORDER — HEPARIN (PORCINE) IN NACL 2-0.9 UNIT/ML-% IJ SOLN
INTRAMUSCULAR | Status: AC
  Filled 2017-04-15: qty 1000

## 2017-04-15 MED ORDER — SODIUM CHLORIDE 0.9 % IV SOLN
INTRAVENOUS | Status: DC
  Administered 2017-04-15: 07:00:00 via INTRAVENOUS

## 2017-04-15 MED ORDER — SODIUM CHLORIDE 0.9% FLUSH: 3.0000 mL | Freq: Two times a day (BID) | INTRAVENOUS | Status: DC

## 2017-04-15 MED ORDER — SODIUM CHLORIDE 0.9% FLUSH: 3.0000 mL | INTRAVENOUS | Status: DC | PRN

## 2017-04-15 MED ORDER — IOPAMIDOL (ISOVUE-370) INJECTION 76%
INTRAVENOUS | Status: DC | PRN
  Administered 2017-04-15: 130 mL via INTRAVENOUS

## 2017-04-15 MED ORDER — VERAPAMIL HCL 2.5 MG/ML IV SOLN
INTRAVENOUS | Status: AC
  Filled 2017-04-15: qty 2

## 2017-04-15 MED ORDER — ACETAMINOPHEN 325 MG PO TABS: 650.0000 mg | ORAL_TABLET | ORAL | Status: DC | PRN

## 2017-04-15 MED ORDER — FENTANYL CITRATE (PF) 100 MCG/2ML IJ SOLN
INTRAMUSCULAR | Status: DC | PRN
  Administered 2017-04-15 (×3): 50 ug via INTRAVENOUS

## 2017-04-15 MED ORDER — IOPAMIDOL (ISOVUE-370) INJECTION 76%
INTRAVENOUS | Status: AC
  Filled 2017-04-15: qty 125

## 2017-04-15 MED ORDER — ASPIRIN 81 MG PO CHEW: 81.0000 mg | CHEWABLE_TABLET | Freq: Every day | ORAL | Status: DC

## 2017-04-15 MED ORDER — LIDOCAINE HCL (PF) 1 % IJ SOLN
INTRAMUSCULAR | Status: AC
  Filled 2017-04-15: qty 30

## 2017-04-15 MED ORDER — ASPIRIN 81 MG PO CHEW
81.0000 mg | CHEWABLE_TABLET | ORAL | Status: DC
Start: 2017-04-16 — End: 2017-04-15

## 2017-04-15 SURGICAL SUPPLY — 11 items
CATH INFINITI 5 FR IM (CATHETERS) ×2 IMPLANT
CATH INFINITI 5FR JL4 (CATHETERS) ×2 IMPLANT
CATH INFINITI 5FR MPB2 (CATHETERS) ×2 IMPLANT
DEVICE CLOSURE PERCLS PRGLD 6F (VASCULAR PRODUCTS) ×1 IMPLANT
GUIDEWIRE 3MM J TIP .035 145 (WIRE) ×2 IMPLANT
KIT HEART LEFT (KITS) ×2 IMPLANT
PACK CARDIAC CATHETERIZATION (CUSTOM PROCEDURE TRAY) ×2 IMPLANT
PERCLOSE PROGLIDE 6F (VASCULAR PRODUCTS) ×2
SHEATH PINNACLE 5F 10CM (SHEATH) ×2 IMPLANT
TRANSDUCER W/STOPCOCK (MISCELLANEOUS) ×2 IMPLANT
TUBING CIL FLEX 10 FLL-RA (TUBING) ×2 IMPLANT

## 2017-04-15 NOTE — H&P (View-Only) (Signed)
Cardiology Office Note:    Date:  03/28/2017   ID:  [name], DOB [birth year], MRN [id number]  PCP:  No PCP Per Patient  Cardiologist:  Dr. Dorris Carnes   Electrophysiologist:  n/a  Referring MD: Suzi Roots, MD   F/U of CAD    History of Present Illness:    Joseph Jimenez is a 61 y.o. male with a hx of CAD s/p CABG (L-LAD, S-Dx, L radial-OM, S-AM/LPDA) 09/2016.   Since bypass surgery he has been seen by Leonarda Salon and Kathleen Argue  Since last in clinic he has done well  Breathing is good  No CP  Active  Playing paddle tennis  He is not having any symptoms like he did before CABG   Prior CV studies:   The following studies were reviewed today:  Carotid US 10/09/16 Bilateral ICA 1-39   LHC 10/08/16 LAD proximal 95, mid 50, D1 ostial 90, D2 ostial 80 LCx proximal 40, mid 85, OM1 90 RCA proximal 90, mid 80 EF 55-65  ETT 10/08/16 Blood pressure demonstrated a normal response to exercise. Upsloping ST segment depression ST segment depression was noted during stress in the V5 and V6 leads, beginning at 6 minutes of stress, and returning to baseline after 1-5 minutes of recovery. Clinically negative, electrically positive for ischemia at low HR Pt with signif arrhythmia (PVCs, NSVT) that increased with exercise Test stopped due to arrhythmias  Past Medical History:  Diagnosis Date  . Carotid artery disease ([place])    Carotid US 11/17:Bilateral ICA 1-39  . Coronary artery disease    a. LHC 11/17 with 3 vessel CAD, EF 55-65% // b. Status post CABG with Dr. Roxan Hockey 11/17 L-LAD, S-Dx, L radial-OM, S-AM/LPDA  . Heart murmur    "I've had it my whole life" (10/08/2016)  . High cholesterol   . History of kidney stones    "passed it" (10/08/2016)  . Prostate cancer ([place]) 2005    Past Surgical History:  Procedure Laterality Date  . CARDIAC CATHETERIZATION  10/08/2016  . CARDIAC CATHETERIZATION N/A 10/08/2016   Procedure: Left Heart Cath and Coronary Angiography;   Surgeon: Belva Crome, MD;  Location: [facility];  Service: Cardiovascular;  Laterality: N/A;  . COLONOSCOPY WITH PROPOFOL N/A 01/29/2016   Procedure: COLONOSCOPY WITH PROPOFOL;  Surgeon: Garlan Fair, MD;  Location: [facility];  Service: Endoscopy;  Laterality: N/A;  . CORONARY ARTERY BYPASS GRAFT N/A 10/10/2016   Procedure: CORONARY ARTERY BYPASS GRAFTING (CABG);  Surgeon: Melrose Nakayama, MD;  Location: [facility];  Service: Open Heart Surgery;  Laterality: N/A;  Time 5  using left internal mammary artery, endoscopically harvested right saphenous vein, and left radial artery.  Consuela Mimes N/A 10/10/2016   Procedure: CYSTOSCOPY;  Surgeon: Melrose Nakayama, MD;  Location: [facility];  Service: Open Heart Surgery;  Laterality: N/A;  . INGUINAL HERNIA REPAIR Bilateral 1958   "before I was 33 months old ="  . PROSTATE BIOPSY  2005  . PROSTATECTOMY  2005  . TEE WITHOUT CARDIOVERSION N/A 10/10/2016   Procedure: TRANSESOPHAGEAL ECHOCARDIOGRAM (TEE);  Surgeon: Melrose Nakayama, MD;  Location: [facility];  Service: Open Heart Surgery;  Laterality: N/A;    Current Medications: Current Meds  Medication Sig  . aspirin EC 325 MG EC tablet Take 1 tablet (325 mg total) by mouth daily.  Marland Kitchen atorvastatin (LIPITOR) 80 MG tablet Take 1 tablet (80 mg total) by mouth daily at 6 PM.  . ezetimibe (ZETIA)  10 MG tablet Take 1 tablet (10 mg total) by mouth daily.  . metoprolol succinate (TOPROL-XL) 25 MG 24 hr tablet Take 1 tablet (25 mg total) by mouth daily.  . Multiple Vitamin (MULTIVITAMIN) tablet Take 1 tablet by mouth daily.  . nitroGLYCERIN (NITROSTAT) 0.4 MG SL tablet Place 0.4 mg under the tongue every 5 (five) minutes as needed for chest pain.     Allergies:   Patient has no known allergies.   Social History   Social History  . Marital status: Married    Spouse name: N/A  . Number of children: N/A  . Years of education: N/A   Social History Main Topics  . Smoking status: Former Smoker     Packs/day: 1.00    Years: 20.00    Types: Cigarettes    Quit date: 09/15/1998  . Smokeless tobacco: Never Used  . Alcohol use Yes     Comment: "quit drinking 09/15/1998"  . Drug use: Yes     Comment: 10/08/2016 "experimented in college; none since"  . Sexual activity: Yes   Other Topics Concern  . None   Social History Narrative  . None     Family History  Problem Relation Age of Onset  . CAD Mother   . Obesity Brother      ROS:   Please see the history of present illness.    ROS All other systems reviewed and are negative.   EKGs/Labs/Other Test Reviewed:    EKG:  EKG is not  ordered today.    Recent Labs: 10/11/2016: Magnesium 2.3 10/13/2016: BUN 11; Creatinine, Ser 0.87; Hemoglobin 11.3; Platelets 151; Potassium 3.8; Sodium 138 12/30/2016: ALT 16   Recent Lipid Panel    Component Value Date/Time   CHOL 121 03/03/2017 0837   TRIG 79 03/03/2017 0837   HDL 51 03/03/2017 0837   CHOLHDL 2.4 03/03/2017 0837   CHOLHDL 4.3 10/07/2016 0816   VLDL 21 10/07/2016 0816   LDLCALC 54 03/03/2017 0837     Physical Exam:    VS:  BP (!) 142/86   Pulse 66   Ht 5\' 11"  (1.803 m)   Wt 203 lb 6.4 oz (92.3 kg)   SpO2 98%   BMI 28.37 kg/m     Wt Readings from Last 3 Encounters:  03/28/17 203 lb 6.4 oz (92.3 kg)  02/04/17 206 lb 6.4 oz (93.6 kg)  12/09/16 200 lb (90.7 kg)     Physical Exam  Constitutional: He is oriented to person, place, and time. He appears well-developed and well-nourished. No distress.  HENT:  Head: Normocephalic and atraumatic.  Eyes: No scleral icterus.  Neck: No JVD present.  Cardiovascular: Normal rate, regular rhythm and normal heart sounds.   No murmur heard. Pulmonary/Chest: He has no wheezes. He has no rales.  Abdominal: There is no tenderness.  Musculoskeletal: He exhibits no edema.  Neurological: He is alert and oriented to person, place, and time.  Skin: Skin is warm and dry.  Psychiatric: He has a normal mood and affect.      PLAN:    In order of problems listed above:  1. Coronary artery disease involving native coronary artery of native heart without angina pectoris - Doing very good Very active  The pt wants to get back to work  For this he is required to have a nuclear GXT and cath  2. Hyperlipidemia, unspecified hyperlipidemia type -   Lipids are much better on statin and Zetia  Continue    3.  Bilateral carotid artery disease (HCC) -  Minimal plaque on pre-op dopplers.  Consider FU Carotid US in 09/2018.  4  HTN  BP is a little elevated  I encouraged him to check it at home  Should be in 110s to 120s      Dispo:  F/U in the fall     Medication Adjustments/Labs and Tests Ordered: Current medicines are reviewed at length with the patient today.  Concerns regarding medicines are outlined above.  Medication changes, Labs and Tests ordered today are outlined in the Patient Instructions noted below. Patient Instructions  For stress test on 5/17: 1.) hold metoprolol succinate for 24 hours prior to test (do not take on 5/16) 2.) no eating or drinking except water for 2 hours prior to the test 3.) no caffeine, decaf or chocolate for 12 hours prior  Cath instructions:   Signed, Dorris Carnes, MD  03/28/2017 10:30 AM    [facility] [address], [place], [place]  [address] Phone: [phone]; Fax: [phone]

## 2017-04-15 NOTE — Interval H&P Note (Signed)
Cath Lab Visit (complete for each Cath Lab visit)  Clinical Evaluation Leading to the Procedure:   ACS: No.  Non-ACS:    Anginal Classification: No Symptoms  Anti-ischemic medical therapy: No Therapy  Non-Invasive Test Results: No non-invasive testing performed  Prior CABG: Previous CABG      History and Physical Interval Note:  04/15/2017 7:28 AM  [name]  has presented today for surgery, with the diagnosis of post cabg cad  The various methods of treatment have been discussed with the patient and family. After consideration of risks, benefits and other options for treatment, the patient has consented to  Procedure(s): Left Heart Cath and Cors/Grafts Angiography (N/A) as a surgical intervention .  The patient's history has been reviewed, patient examined, no change in status, stable for surgery.  I have reviewed the patient's chart and labs.  Questions were answered to the patient's satisfaction.     Joseph Jimenez

## 2017-04-15 NOTE — Discharge Instructions (Signed)
Femoral Site Care °Refer to this sheet in the next few weeks. These instructions provide you with information about caring for yourself after your procedure. Your health care provider may also give you more specific instructions. Your treatment has been planned according to current medical practices, but problems sometimes occur. Call your health care provider if you have any problems or questions after your procedure. °What can I expect after the procedure? °After your procedure, it is typical to have the following: °· Bruising at the site that usually fades within 1-2 weeks. °· Blood collecting in the tissue (hematoma) that may be painful to the touch. It should usually decrease in size and tenderness within 1-2 weeks. °Follow these instructions at home: °· Take medicines only as directed by your health care provider. °· You may shower 24-48 hours after the procedure or as directed by your health care provider. Remove the bandage (dressing) and gently wash the site with plain soap and water. Pat the area dry with a clean towel. Do not rub the site, because this may cause bleeding. °· Do not take baths, swim, or use a hot tub until your health care provider approves. °· Check your insertion site every day for redness, swelling, or drainage. °· Do not apply powder or lotion to the site. °· Limit use of stairs to twice a day for the first 2-3 days or as directed by your health care provider. °· Do not squat for the first 2-3 days or as directed by your health care provider. °· Do not lift over 10 lb (4.5 kg) for 5 days after your procedure or as directed by your health care provider. °· Ask your health care provider when it is okay to: °¨ Return to work or school. °¨ Resume usual physical activities or sports. °¨ Resume sexual activity. °· Do not drive home if you are discharged the same day as the procedure. Have someone else drive you. °· You may drive 24 hours after the procedure unless otherwise instructed by  your health care provider. °· Do not operate machinery or power tools for 24 hours after the procedure or as directed by your health care provider. °· If your procedure was done as an outpatient procedure, which means that you went home the same day as your procedure, a responsible adult should be with you for the first 24 hours after you arrive home. °· Keep all follow-up visits as directed by your health care provider. This is important. °Contact a health care provider if: °· You have a fever. °· You have chills. °· You have increased bleeding from the site. Hold pressure on the site. °Get help right away if: °· You have unusual pain at the site. °· You have redness, warmth, or swelling at the site. °· You have drainage (other than a small amount of blood on the dressing) from the site. °· The site is bleeding, and the bleeding does not stop after 30 minutes of holding steady pressure on the site. °· Your leg or foot becomes pale, cool, tingly, or numb. °This information is not intended to replace advice given to you by your health care provider. Make sure you discuss any questions you have with your health care provider. °Document Released: 07/15/2014 Document Revised: 04/18/2016 Document Reviewed: 05/31/2014 °Elsevier Interactive Patient Education © 2017 Elsevier Inc. ° °

## 2017-04-16 ENCOUNTER — Telehealth: Payer: Self-pay | Admitting: Internal Medicine

## 2017-04-17 NOTE — Telephone Encounter (Signed)
Error

## 2017-05-05 ENCOUNTER — Other Ambulatory Visit: Payer: Self-pay | Admitting: *Deleted

## 2017-05-05 ENCOUNTER — Other Ambulatory Visit: Payer: BLUE CROSS/BLUE SHIELD | Admitting: *Deleted

## 2017-05-05 DIAGNOSIS — I2510 Atherosclerotic heart disease of native coronary artery without angina pectoris: Secondary | ICD-10-CM | POA: Diagnosis not present

## 2017-05-05 DIAGNOSIS — Z951 Presence of aortocoronary bypass graft: Secondary | ICD-10-CM

## 2017-05-06 ENCOUNTER — Encounter: Payer: Self-pay | Admitting: Internal Medicine

## 2017-05-06 LAB — HEMOGLOBIN A1C
Est. average glucose Bld gHb Est-mCnc: 117 mg/dL
Hgb A1c MFr Bld: 5.7 % — ABNORMAL HIGH (ref 4.8–5.6)

## 2017-05-07 ENCOUNTER — Encounter: Payer: Self-pay | Admitting: *Deleted

## 2017-05-08 ENCOUNTER — Encounter: Payer: Self-pay | Admitting: *Deleted

## 2017-05-08 ENCOUNTER — Encounter: Payer: Self-pay | Admitting: Internal Medicine

## 2017-06-09 DIAGNOSIS — L821 Other seborrheic keratosis: Secondary | ICD-10-CM | POA: Diagnosis not present

## 2017-06-09 DIAGNOSIS — D2371 Other benign neoplasm of skin of right lower limb, including hip: Secondary | ICD-10-CM | POA: Diagnosis not present

## 2017-06-09 DIAGNOSIS — L570 Actinic keratosis: Secondary | ICD-10-CM | POA: Diagnosis not present

## 2017-06-25 ENCOUNTER — Other Ambulatory Visit: Payer: Self-pay | Admitting: Internal Medicine

## 2017-06-25 DIAGNOSIS — E785 Hyperlipidemia, unspecified: Secondary | ICD-10-CM

## 2017-06-25 DIAGNOSIS — I2510 Atherosclerotic heart disease of native coronary artery without angina pectoris: Secondary | ICD-10-CM

## 2017-12-16 ENCOUNTER — Telehealth: Payer: Self-pay | Admitting: Internal Medicine

## 2017-12-16 DIAGNOSIS — Z79899 Other long term (current) drug therapy: Secondary | ICD-10-CM

## 2017-12-16 DIAGNOSIS — E782 Mixed hyperlipidemia: Secondary | ICD-10-CM

## 2017-12-16 NOTE — Telephone Encounter (Signed)
SPoke to pt  He was seen last spring   On lipitor 80 and Zetia I would recomm continuing Zetia  He can switch to Crestor 40 so that he can take at other times of day  Stop lipitor  F/U lipids in 2 months   He can switch to 81 mg ASA

## 2017-12-17 ENCOUNTER — Telehealth: Payer: Self-pay | Admitting: Internal Medicine

## 2017-12-17 MED ORDER — ROSUVASTATIN CALCIUM 40 MG PO TABS: 40.0000 mg | ORAL_TABLET | Freq: Every day | ORAL | 3 refills | Status: DC

## 2017-12-17 NOTE — Telephone Encounter (Signed)
Patient called back. Patient given instructions for medication changes. Patient will come in on 02/20/18 for lab work. Patient requested to have a exercise myoview for his annual physical for work. There is no order for myoview, will send message to see if  Dr. Harrington Challenger is okay with myoview being ordered.

## 2017-12-17 NOTE — Telephone Encounter (Signed)
New Message   Patient is returning call. Please call.

## 2017-12-17 NOTE — Addendum Note (Signed)
Addended by: Aris Georgia, Kerri-Anne Haeberle L on: 12/17/2017 09:49 AM   Modules accepted: Orders

## 2017-12-17 NOTE — Telephone Encounter (Signed)
Patient wants to get a myoview scheduled in May. Will send to Dr. Harrington Challenger for order.

## 2017-12-17 NOTE — Telephone Encounter (Signed)
Left message for patient to call back  

## 2017-12-17 NOTE — Telephone Encounter (Signed)
See phone from 12/16/17

## 2017-12-18 ENCOUNTER — Encounter: Payer: Self-pay | Admitting: Internal Medicine

## 2017-12-19 NOTE — Telephone Encounter (Signed)
See my chart message.  Pt will see Dr. Harrington Challenger in May and test will be scheduled after this office visit.

## 2018-01-30 ENCOUNTER — Other Ambulatory Visit: Payer: Self-pay | Admitting: Internal Medicine

## 2018-01-30 DIAGNOSIS — I2510 Atherosclerotic heart disease of native coronary artery without angina pectoris: Secondary | ICD-10-CM

## 2018-01-30 DIAGNOSIS — E785 Hyperlipidemia, unspecified: Secondary | ICD-10-CM

## 2018-02-16 ENCOUNTER — Other Ambulatory Visit: Payer: BLUE CROSS/BLUE SHIELD | Admitting: *Deleted

## 2018-02-16 DIAGNOSIS — Z79899 Other long term (current) drug therapy: Secondary | ICD-10-CM

## 2018-02-16 DIAGNOSIS — E782 Mixed hyperlipidemia: Secondary | ICD-10-CM

## 2018-02-17 LAB — LIPID PANEL
CHOLESTEROL TOTAL: 119 mg/dL (ref 100–199)
Chol/HDL Ratio: 2.4 ratio (ref 0.0–5.0)
HDL: 50 mg/dL (ref >39)
LDL Calculated: 50 mg/dL (ref 0–99)
TRIGLYCERIDES: 94 mg/dL (ref 0–149)
VLDL Cholesterol Cal: 19 mg/dL (ref 5–40)

## 2018-02-17 LAB — HEPATIC FUNCTION PANEL
ALK PHOS: 72 IU/L (ref 39–117)
ALT: 22 IU/L (ref 0–44)
AST: 24 IU/L (ref 0–40)
Albumin: 4.3 g/dL (ref 3.6–4.8)
Bilirubin Total: 0.6 mg/dL (ref 0.0–1.2)
Bilirubin, Direct: 0.18 mg/dL (ref 0.00–0.40)
Total Protein: 6.5 g/dL (ref 6.0–8.5)

## 2018-02-18 ENCOUNTER — Encounter: Payer: Self-pay | Admitting: Internal Medicine

## 2018-02-20 ENCOUNTER — Other Ambulatory Visit: Payer: BLUE CROSS/BLUE SHIELD

## 2018-03-12 ENCOUNTER — Other Ambulatory Visit: Payer: Self-pay | Admitting: Internal Medicine

## 2018-03-12 DIAGNOSIS — I2510 Atherosclerotic heart disease of native coronary artery without angina pectoris: Secondary | ICD-10-CM

## 2018-03-12 DIAGNOSIS — E785 Hyperlipidemia, unspecified: Secondary | ICD-10-CM

## 2018-03-27 ENCOUNTER — Ambulatory Visit: Payer: BLUE CROSS/BLUE SHIELD | Admitting: Internal Medicine

## 2018-03-27 ENCOUNTER — Encounter: Payer: Self-pay | Admitting: Internal Medicine

## 2018-03-27 VITALS — BP 122/76 | HR 65 | Ht 71.0 in | Wt 214.4 lb

## 2018-03-27 DIAGNOSIS — E785 Hyperlipidemia, unspecified: Secondary | ICD-10-CM | POA: Diagnosis not present

## 2018-03-27 DIAGNOSIS — I10 Essential (primary) hypertension: Secondary | ICD-10-CM

## 2018-03-27 DIAGNOSIS — I2510 Atherosclerotic heart disease of native coronary artery without angina pectoris: Secondary | ICD-10-CM | POA: Diagnosis not present

## 2018-03-27 DIAGNOSIS — I6523 Occlusion and stenosis of bilateral carotid arteries: Secondary | ICD-10-CM

## 2018-03-27 MED ORDER — METOPROLOL SUCCINATE ER 25 MG PO TB24: ORAL_TABLET | ORAL | 0 refills | Status: DC

## 2018-03-27 NOTE — Progress Notes (Signed)
Cardiology Office Note:    Date:  03/27/2018   ID:  Joseph Jimenez, DOB [birth year], MRN [id number]  PCP:  Patient, No Pcp Per  Cardiologist:  Dr. Dorris Carnes   Electrophysiologist:  n/a  Referring MD: No ref. provider found   F/U of CAD    History of Present Illness:    Joseph Jimenez is a 62 y.o. male with a hx of CAD  Cath in November 2017 (LAD 95% prox, 50% mid; D1 0stial 90%; D2 ostial 80%; LCx 85% mid; OM1 905 ; RCA 90% prox; 80% mid; LVEF normal)  He is  s/p CABG (L-LAD, S-Dx, L radial-OM, S-AM/LPDA) 09/2016.    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 Carotid artery disease ([place])    Carotid US 11/17:Bilateral ICA 1-39  . Coronary artery disease    a. LHC 11/17 with 3 vessel CAD, EF 55-65% // b. Status post CABG with Dr. Roxan Hockey 11/17 L-LAD, S-Dx, L radial-OM, S-AM/LPDA  . Heart murmur    "I've had it my whole life" (10/08/2016)  . High cholesterol   . History of kidney stones    "passed it" (10/08/2016)  . Prostate cancer ([place]) 2005    Past Surgical History:  Procedure Laterality Date  . CARDIAC CATHETERIZATION  10/08/2016  . CARDIAC CATHETERIZATION N/A 10/08/2016   Procedure: Left Heart Cath and Coronary Angiography;  Surgeon: Belva Crome, MD;  Location: [facility];  Service: Cardiovascular;  Laterality: N/A;  . COLONOSCOPY WITH PROPOFOL N/A 01/29/2016   Procedure: COLONOSCOPY WITH PROPOFOL;  Surgeon: Garlan Fair, MD;  [facility];  Service: Endoscopy;  Laterality: N/A;  . CORONARY ARTERY BYPASS GRAFT N/A 10/10/2016   Procedure: CORONARY ARTERY BYPASS GRAFTING (CABG);  Surgeon: Melrose Nakayama, MD;  Location: [facility];  Service: Open Heart Surgery;  Laterality: N/A;   Time 5  using left internal mammary artery, endoscopically harvested right saphenous vein, and left radial artery.  Consuela Mimes N/A 10/10/2016   Procedure: CYSTOSCOPY;  Surgeon: Melrose Nakayama, MD;  Location: [facility];  Service: Open Heart Surgery;  Laterality: N/A;  . INGUINAL HERNIA REPAIR Bilateral 1958   "before I was 25 months old ="  . LEFT HEART CATH AND CORS/GRAFTS ANGIOGRAPHY N/A 04/15/2017   Procedure: Left Heart Cath and Cors/Grafts Angiography;  Surgeon: Belva Crome, MD;  Location: [facility];  Service: Cardiovascular;  Laterality: N/A;  . PROSTATE BIOPSY  2005  . PROSTATECTOMY  2005  . TEE WITHOUT CARDIOVERSION N/A 10/10/2016   Procedure: TRANSESOPHAGEAL ECHOCARDIOGRAM (TEE);  Surgeon: Melrose Nakayama, MD;  Location: [facility];  Service: Open Heart Surgery;  Laterality: N/A;    Current Medications: Current Meds  Medication Sig  . aspirin EC 81 MG tablet Take 1 tablet (81 mg total) by mouth daily.  Marland Kitchen ezetimibe (ZETIA) 10 MG tablet Take 1 tablet (10 mg total) by mouth daily. Please keep upcoming appt with Dr. Harrington Challenger in May for future refills. Thank you  . metoprolol succinate (TOPROL-XL) 25 MG 24 hr tablet TAKE 1 TABLET (25 MG TOTAL) BY MOUTH  DAILY.  . nitroGLYCERIN (NITROSTAT) 0.4 MG SL tablet Place 0.4 mg under the tongue every 5 (five) minutes as needed for chest pain.  . rosuvastatin (CRESTOR) 40 MG tablet Take 1 tablet (40 mg total) by mouth daily.     Allergies:   Patient has no known allergies.   Social History   Socioeconomic History  . Marital status: Married    Spouse name: Not on file  . Number of children: Not on file  . Years of education: Not on file  . Highest education level: Not on file  Occupational History  . Not on file  Social Needs  . Financial resource strain: Not on file  . Food insecurity:    Worry: Not on file    Inability: Not on file  . Transportation needs:    Medical: Not on file    Non-medical: Not on file  Tobacco Use    . Smoking status: Former Smoker    Packs/day: 1.00    Years: 20.00    Pack years: 20.00    Types: Cigarettes    Last attempt to quit: 09/15/1998    Years since quitting: 19.5  . Smokeless tobacco: Never Used  Substance and Sexual Activity  . Alcohol use: Yes    Comment: "quit drinking 09/15/1998"  . Drug use: Yes    Comment: 10/08/2016 "experimented in college; none since"  . Sexual activity: Yes  Lifestyle  . Physical activity:    Days per week: Not on file    Minutes per session: Not on file  . Stress: Not on file  Relationships  . Social connections:    Talks on phone: Not on file    Gets together: Not on file    Attends religious service: Not on file    Active member of club or organization: Not on file    Attends meetings of clubs or organizations: Not on file    Relationship status: Not on file  Other Topics Concern  . Not on file  Social History Narrative  . Not on file     Family History  Problem Relation Age of Onset  . CAD Mother   . Obesity Brother      ROS:   Please see the history of present illness.    ROS All other systems reviewed and are negative.   EKGs/Labs/Other Test Reviewed:    EKG:  EKG is  ordered today.  SR 65 bpm    Recent Labs: 04/10/2017: BUN 21; Creatinine, Ser 0.84; Hemoglobin 16.0; Platelets 198; Potassium 4.8; Sodium 138 02/16/2018: ALT 22   Recent Lipid Panel    Component Value Date/Time   CHOL 119 02/16/2018 0828   TRIG 94 02/16/2018 0828   HDL 50 02/16/2018 0828   CHOLHDL 2.4 02/16/2018 0828   CHOLHDL 4.3 10/07/2016 0816   VLDL 21 10/07/2016 0816   LDLCALC 50 02/16/2018 0828     Physical Exam:    VS:  BP 122/76   Pulse 65   Ht 5\' 11"  (1.803 m)   Wt 214 lb 6.4 oz (97.3 kg)   BMI 29.90 kg/m     Wt Readings from Last 3 Encounters:  03/27/18 214 lb 6.4 oz (97.3 kg)  04/15/17 200 lb (90.7 kg)  03/28/17 203 lb 6.4 oz (92.3 kg)    PE  Pt in NAD HEENT  NCAT NEck  Bruit on R carotd Cardiac RRR  NO S3   No  murmurs Lungs CTA ABd:   Normal BS  SUpple  No masses Ext   No edema  2+ pulses    PLAN:      1  CAD   S/p CABG in Nov 2017  Doing very well  Active   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 Bilateral carotid artery disease (HCC) -  Minimal plaque on pre-op dopplers.  Will f/u in future  .  4  HTN  BP is OK   He will taper back on low dose b blocker  Follow BP  Stop if BP is remains OK     Dispo:  F/U in 1 year      Medication Adjustments/Labs and Tests Ordered: Current medicines are reviewed at length with the patient today.  Concerns regarding medicines are outlined above.  Medication changes, Labs and Tests ordered today are outlined in the Patient Instructions noted below. There are no Patient Instructions on file for this visit. Signed, Dorris Carnes, MD  03/27/2018 2:57 PM    [facility] [address], [place], [place]  [address] Phone: [phone]; Fax: [phone]

## 2018-03-27 NOTE — Patient Instructions (Signed)
Your physician has recommended you make the following change in your medication:  1.) metoprolol 25 mg - take 1/2 tablet by mouth once a day for one week then stop  Your physician has requested that you have an exercise tolerance test. For further information please visit [email or web address]. Please also follow instruction sheet, as given.  Your physician wants you to follow-up in: 1 year with Dr. Harrington Challenger.  You will receive a reminder letter in the mail two months in advance. If you don't receive a letter, please call our office to schedule the follow-up appointment.

## 2018-04-22 ENCOUNTER — Encounter: Payer: Self-pay | Admitting: Internal Medicine

## 2018-04-22 ENCOUNTER — Ambulatory Visit (INDEPENDENT_AMBULATORY_CARE_PROVIDER_SITE_OTHER): Payer: BLUE CROSS/BLUE SHIELD

## 2018-04-22 DIAGNOSIS — I2510 Atherosclerotic heart disease of native coronary artery without angina pectoris: Secondary | ICD-10-CM

## 2018-04-22 DIAGNOSIS — E785 Hyperlipidemia, unspecified: Secondary | ICD-10-CM | POA: Diagnosis not present

## 2018-04-22 LAB — EXERCISE TOLERANCE TEST
CHL CUP MPHR: 159 {beats}/min
CHL CUP RESTING HR STRESS: 69 {beats}/min
Estimated workload: 11.8 METS
Exercise duration (min): 10 min
Exercise duration (sec): 5 s
Peak HR: 162 {beats}/min
Percent HR: 101 %
RPE: 16

## 2018-04-24 ENCOUNTER — Encounter: Payer: Self-pay | Admitting: *Deleted

## 2018-04-24 ENCOUNTER — Encounter: Payer: Self-pay | Admitting: Internal Medicine

## 2018-04-24 ENCOUNTER — Other Ambulatory Visit: Payer: Self-pay | Admitting: Internal Medicine

## 2018-04-24 DIAGNOSIS — E785 Hyperlipidemia, unspecified: Secondary | ICD-10-CM

## 2018-04-24 DIAGNOSIS — I2510 Atherosclerotic heart disease of native coronary artery without angina pectoris: Secondary | ICD-10-CM

## 2018-04-24 NOTE — Telephone Encounter (Signed)
Received refill request for Metoprolol, I saw that on 03/27/18 OV patient was told to take 1/2 tablet for 1 week then stop. Patient was to monitor blood pressures and come off medication if blood pressure was ok. It was sent for 90 tablets, so he shouldn't be out of the medicine.

## 2018-04-27 NOTE — Telephone Encounter (Signed)
Medication should not be refilled. Thank you.

## 2018-04-30 ENCOUNTER — Encounter: Payer: Self-pay | Admitting: Internal Medicine

## 2018-04-30 ENCOUNTER — Telehealth: Payer: Self-pay | Admitting: Internal Medicine

## 2018-04-30 DIAGNOSIS — I2510 Atherosclerotic heart disease of native coronary artery without angina pectoris: Secondary | ICD-10-CM

## 2018-04-30 NOTE — Addendum Note (Signed)
Addended by: Rodman Key on: 04/30/2018 04:07 PM   Modules accepted: Orders

## 2018-04-30 NOTE — Telephone Encounter (Signed)
Lab orders placed stat BMET and CBC. Sent MyChart message to patient to come tomorrow for labs unless he can get here today before 4:45 pm.

## 2018-04-30 NOTE — Telephone Encounter (Signed)
Patient needs lab work for [employer], BMET  Sent stat  Had lipids from [employer] for [employer]

## 2018-05-01 ENCOUNTER — Other Ambulatory Visit: Payer: Self-pay | Admitting: Internal Medicine

## 2018-05-01 ENCOUNTER — Other Ambulatory Visit: Payer: BLUE CROSS/BLUE SHIELD | Admitting: *Deleted

## 2018-05-01 DIAGNOSIS — I2510 Atherosclerotic heart disease of native coronary artery without angina pectoris: Secondary | ICD-10-CM

## 2018-05-01 DIAGNOSIS — E785 Hyperlipidemia, unspecified: Secondary | ICD-10-CM

## 2018-05-01 LAB — BASIC METABOLIC PANEL
BUN/Creatinine Ratio: 19 (ref 10–24)
BUN: 18 mg/dL (ref 8–27)
CALCIUM: 9.3 mg/dL (ref 8.6–10.2)
CHLORIDE: 106 mmol/L (ref 96–106)
CO2: 28 mmol/L (ref 20–29)
Creatinine, Ser: 0.94 mg/dL (ref 0.76–1.27)
GFR calc non Af Amer: 87 mL/min/{1.73_m2} (ref >59)
GFR, EST AFRICAN AMERICAN: 101 mL/min/{1.73_m2} (ref >59)
Glucose: 115 mg/dL — ABNORMAL HIGH (ref 65–99)
POTASSIUM: 5.1 mmol/L (ref 3.5–5.2)
Sodium: 141 mmol/L (ref 134–144)

## 2018-05-01 LAB — CBC
HEMOGLOBIN: 15.9 g/dL (ref 13.0–17.7)
Hematocrit: 45.9 % (ref 37.5–51.0)
MCH: 30.3 pg (ref 26.6–33.0)
MCHC: 34.6 g/dL (ref 31.5–35.7)
MCV: 88 fL (ref 79–97)
Platelets: 184 10*3/uL (ref 150–450)
RBC: 5.24 x10E6/uL (ref 4.14–5.80)
RDW: 14.7 % (ref 12.3–15.4)
WBC: 7.7 10*3/uL (ref 3.4–10.8)

## 2018-05-01 NOTE — Telephone Encounter (Signed)
Outpatient Medication Detail    Disp Refills Start End   ezetimibe (ZETIA) 10 MG tablet 90 tablet 0 03/13/2018    Sig - Route: Take 1 tablet (10 mg total) by mouth daily. Please keep upcoming appt with Dr. Harrington Challenger in May for future refills. Thank you - Oral   Sent to pharmacy as: ezetimibe (ZETIA) 10 MG tablet   E-Prescribing Status: Receipt confirmed by pharmacy (03/13/2018 8:58 AM EDT)   Associated Diagnoses   Atherosclerosis of native coronary artery of native heart without angina pectoris     Hyperlipidemia, unspecified hyperlipidemia type     Pharmacy   [facility], [place] - [address]

## 2018-05-05 ENCOUNTER — Telehealth: Payer: Self-pay | Admitting: Internal Medicine

## 2018-05-05 DIAGNOSIS — I2510 Atherosclerotic heart disease of native coronary artery without angina pectoris: Secondary | ICD-10-CM

## 2018-05-05 DIAGNOSIS — E7849 Other hyperlipidemia: Secondary | ICD-10-CM

## 2018-05-05 NOTE — Telephone Encounter (Signed)
PT called in   Needs to have A1C done  He wants to come in tomorrow morning to have done

## 2018-05-05 NOTE — Telephone Encounter (Signed)
Order placed for lab for tomorrow. Appointment made

## 2018-05-07 ENCOUNTER — Other Ambulatory Visit: Payer: BLUE CROSS/BLUE SHIELD | Admitting: *Deleted

## 2018-05-07 DIAGNOSIS — I2510 Atherosclerotic heart disease of native coronary artery without angina pectoris: Secondary | ICD-10-CM | POA: Diagnosis not present

## 2018-05-07 DIAGNOSIS — E7849 Other hyperlipidemia: Secondary | ICD-10-CM

## 2018-05-08 LAB — HEMOGLOBIN A1C
Est. average glucose Bld gHb Est-mCnc: 117 mg/dL
Hgb A1c MFr Bld: 5.7 % — ABNORMAL HIGH (ref 4.8–5.6)

## 2018-05-11 ENCOUNTER — Other Ambulatory Visit: Payer: Self-pay

## 2018-05-11 DIAGNOSIS — E785 Hyperlipidemia, unspecified: Secondary | ICD-10-CM

## 2018-05-11 DIAGNOSIS — I2510 Atherosclerotic heart disease of native coronary artery without angina pectoris: Secondary | ICD-10-CM

## 2018-05-11 MED ORDER — EZETIMIBE 10 MG PO TABS: 10.0000 mg | ORAL_TABLET | Freq: Every day | ORAL | 3 refills | Status: DC

## 2018-05-21 ENCOUNTER — Ambulatory Visit: Payer: BLUE CROSS/BLUE SHIELD | Admitting: Internal Medicine

## 2018-06-18 DIAGNOSIS — L570 Actinic keratosis: Secondary | ICD-10-CM | POA: Diagnosis not present

## 2018-06-18 DIAGNOSIS — D1801 Hemangioma of skin and subcutaneous tissue: Secondary | ICD-10-CM | POA: Diagnosis not present

## 2018-06-18 DIAGNOSIS — L812 Freckles: Secondary | ICD-10-CM | POA: Diagnosis not present

## 2018-06-18 DIAGNOSIS — L821 Other seborrheic keratosis: Secondary | ICD-10-CM | POA: Diagnosis not present

## 2018-06-18 DIAGNOSIS — D225 Melanocytic nevi of trunk: Secondary | ICD-10-CM | POA: Diagnosis not present

## 2018-10-07 NOTE — Progress Notes (Signed)
[facility] [address], [place] [address] Phone: [phone] Subjective:   Fontaine No, am serving as a scribe for Dr. Hulan Saas.   CC: Right knee pain  HER:[id number]  Joseph Jimenez is a 62 y.o. male coming in with complaint of right knee pain. Pain is within the joint. Is very active playing golf and pickleball. Tried to pick up door with foot and this caused him pain. Has had pain for 1 year but increasing recently. Has not used brace. Has not tried IBU.  Rates the severity pain is 5 out of 10      Past Medical History:  Diagnosis Date  . Carotid artery disease ([place])    Carotid US 11/17:Bilateral ICA 1-39  . Coronary artery disease    a. LHC 11/17 with 3 vessel CAD, EF 55-65% // b. Status post CABG with Dr. Roxan Hockey 11/17 L-LAD, S-Dx, L radial-OM, S-AM/LPDA  . Heart murmur    "I've had it my whole life" (10/08/2016)  . High cholesterol   . History of kidney stones    "passed it" (10/08/2016)  . Prostate cancer ([place]) 2005   Past Surgical History:  Procedure Laterality Date  . CARDIAC CATHETERIZATION  10/08/2016  . CARDIAC CATHETERIZATION N/A 10/08/2016   Procedure: Left Heart Cath and Coronary Angiography;  Surgeon: Belva Crome, MD;  Location: [facility];  Service: Cardiovascular;  Laterality: N/A;  . COLONOSCOPY WITH PROPOFOL N/A 01/29/2016   Procedure: COLONOSCOPY WITH PROPOFOL;  Surgeon: Garlan Fair, MD;  [facility];  Service: Endoscopy;  Laterality: N/A;  . CORONARY ARTERY BYPASS GRAFT N/A 10/10/2016   Procedure: CORONARY ARTERY BYPASS GRAFTING (CABG);  Surgeon: Melrose Nakayama, MD;  Location: [facility];  Service: Open Heart Surgery;  Laterality: N/A;  Time 5  using left internal mammary artery, endoscopically harvested right saphenous vein, and left radial artery.  Consuela Mimes N/A 10/10/2016   Procedure: CYSTOSCOPY;  Surgeon: Melrose Nakayama, MD;  Location: [facility];  Service: Open  Heart Surgery;  Laterality: N/A;  . INGUINAL HERNIA REPAIR Bilateral 1958   "before I was 22 months old ="  . LEFT HEART CATH AND CORS/GRAFTS ANGIOGRAPHY N/A 04/15/2017   Procedure: Left Heart Cath and Cors/Grafts Angiography;  Surgeon: Belva Crome, MD;  Location: [facility];  Service: Cardiovascular;  Laterality: N/A;  . PROSTATE BIOPSY  2005  . PROSTATECTOMY  2005  . TEE WITHOUT CARDIOVERSION N/A 10/10/2016   Procedure: TRANSESOPHAGEAL ECHOCARDIOGRAM (TEE);  Surgeon: Melrose Nakayama, MD;  Location: [facility];  Service: Open Heart Surgery;  Laterality: N/A;   Social History   Socioeconomic History  . Marital status: Married    Spouse name: Not on file  . Number of children: Not on file  . Years of education: Not on file  . Highest education level: Not on file  Occupational History  . Not on file  Social Needs  . Financial resource strain: Not on file  . Food insecurity:    Worry: Not on file    Inability: Not on file  . Transportation needs:    Medical: Not on file    Non-medical: Not on file  Tobacco Use  . Smoking status: Former Smoker    Packs/day: 1.00    Years: 20.00    Pack years: 20.00    Types: Cigarettes    Last attempt to quit: 09/15/1998    Years since quitting: 20.0  . Smokeless  tobacco: Never Used  Substance and Sexual Activity  . Alcohol use: Yes    Comment: "quit drinking 09/15/1998"  . Drug use: Yes    Comment: 10/08/2016 "experimented in college; none since"  . Sexual activity: Yes  Lifestyle  . Physical activity:    Days per week: Not on file    Minutes per session: Not on file  . Stress: Not on file  Relationships  . Social connections:    Talks on phone: Not on file    Gets together: Not on file    Attends religious service: Not on file    Active member of club or organization: Not on file    Attends meetings of clubs or organizations: Not on file    Relationship status: Not on file  Other Topics Concern  . Not on file  Social  History Narrative  . Not on file   No Known Allergies Family History  Problem Relation Age of Onset  . CAD Mother   . Obesity Brother      Current Outpatient Medications (Cardiovascular):  .  ezetimibe (ZETIA) 10 MG tablet, Take 1 tablet (10 mg total) by mouth daily. .  rosuvastatin (CRESTOR) 40 MG tablet, Take 1 tablet (40 mg total) by mouth daily.   Current Outpatient Medications (Analgesics):  .  aspirin EC 81 MG tablet, Take 1 tablet (81 mg total) by mouth daily.      Past medical history, social, surgical and family history all reviewed in electronic medical record.  No pertanent information unless stated regarding to the chief complaint.   Review of Systems:  No headache, visual changes, nausea, vomiting, diarrhea, constipation, dizziness, abdominal pain, skin rash, fevers, chills, night sweats, weight loss, swollen lymph nodes, body aches, joint swelling, chest pain, shortness of breath, mood changes.  Positive muscle aches  Objective  Blood pressure 126/80, pulse 71, height 5\' 11"  (1.803 m), weight 212 lb (96.2 kg), SpO2 97 %.    General: No apparent distress alert and oriented x3 mood and affect normal, dressed appropriately.  HEENT: Pupils equal, extraocular movements intact  Respiratory: Patient's speak in full sentences and does not appear short of breath  Cardiovascular: No lower extremity edema, non tender, no erythema  Skin: Warm dry intact with no signs of infection or rash on extremities or on axial skeleton.  Abdomen: Soft nontender  Neuro: Cranial nerves II through XII are intact, neurovascularly intact in all extremities with 2+ DTRs and 2+ pulses.  Lymph: No lymphadenopathy of posterior or anterior cervical chain or axillae bilaterally.  Gait normal with good balance and coordination.  MSK:  Non tender with full range of motion and good stability and symmetric strength and tone of shoulders, elbows, wrist, hip, and ankles bilaterally.  Knee: Right  knee Normal to inspection with no erythema or effusion or obvious bony abnormalities. Palpation normal with no warmth, joint line tenderness, patellar tenderness, or condyle tenderness. ROM full in flexion and extension and lower leg rotation. Laxity of the ACL compared to the contralateral side Negative Mcmurray's, Apley's, and Thessalonian tests. painful patellar compression. Patellar glide with mild to moderate crepitus. Patellar and quadriceps tendons unremarkable. Hamstring and quadriceps strength is normal. Contralateral knee unremarkable  MSK US performed of: Right knee This study was ordered, performed, and interpreted by Charlann Boxer D.O.  Knee: .  Right knee exam shows the patient does have some degenerative meniscal tear noted.  Patient does also have a very trace effusion noted to the patellofemoral joint but  no significant arthritic changes  IMPRESSION: Trace effusion with medial meniscal tear   Impression and Recommendations:     This case required medical decision making of moderate complexity. The above documentation has been reviewed and is accurate and complete Lyndal Pulley, DO       Note: This dictation was prepared with Dragon dictation along with smaller phrase technology. Any transcriptional errors that result from this process are unintentional.

## 2018-10-08 ENCOUNTER — Ambulatory Visit: Payer: BLUE CROSS/BLUE SHIELD | Admitting: Family Medicine

## 2018-10-08 ENCOUNTER — Encounter: Payer: Self-pay | Admitting: Family Medicine

## 2018-10-08 ENCOUNTER — Ambulatory Visit: Payer: Self-pay

## 2018-10-08 VITALS — BP 126/80 | HR 71 | Ht 71.0 in | Wt 212.0 lb

## 2018-10-08 DIAGNOSIS — G8929 Other chronic pain: Secondary | ICD-10-CM

## 2018-10-08 DIAGNOSIS — M25561 Pain in right knee: Secondary | ICD-10-CM

## 2018-10-08 DIAGNOSIS — M2351 Chronic instability of knee, right knee: Secondary | ICD-10-CM

## 2018-10-08 NOTE — Patient Instructions (Signed)
Good to see you  Ice 20 minutes 2 times daily. Usually after activity and before bed. pennsaid pinkie amount topically 2 times daily as needed.  Exercises 3 times a week.  Wear the brace with twisting motions.  You should do well but I got  A lot of tricks if needed CoQ10 200mg  with your statin.  Vitamin D 2000 IU daiy  See em again in 5-6 weeks

## 2018-10-08 NOTE — Assessment & Plan Note (Signed)
Meniscal tear.  Discussed with patient that I do feel there is some laxity of the ACL as well.  Patient was given a hinged brace that I think will be beneficial with his exercising.  We discussed topical anti-inflammatories and icing regimen, we discussed which activities to do which wants to avoid.  Patient will continue with conservative therapy otherwise at this time.  Follow-up again in 4 to 8 weeks

## 2018-10-25 IMAGING — CR DG CHEST 2V
2 series · 2 of 2 positions shown · non-contrast
Comparison: Chest radiograph performed 06/25/2004

CLINICAL DATA: Preoperative chest radiograph.  Initial encounter.

EXAM:
CHEST  2 VIEW

[chest pa]
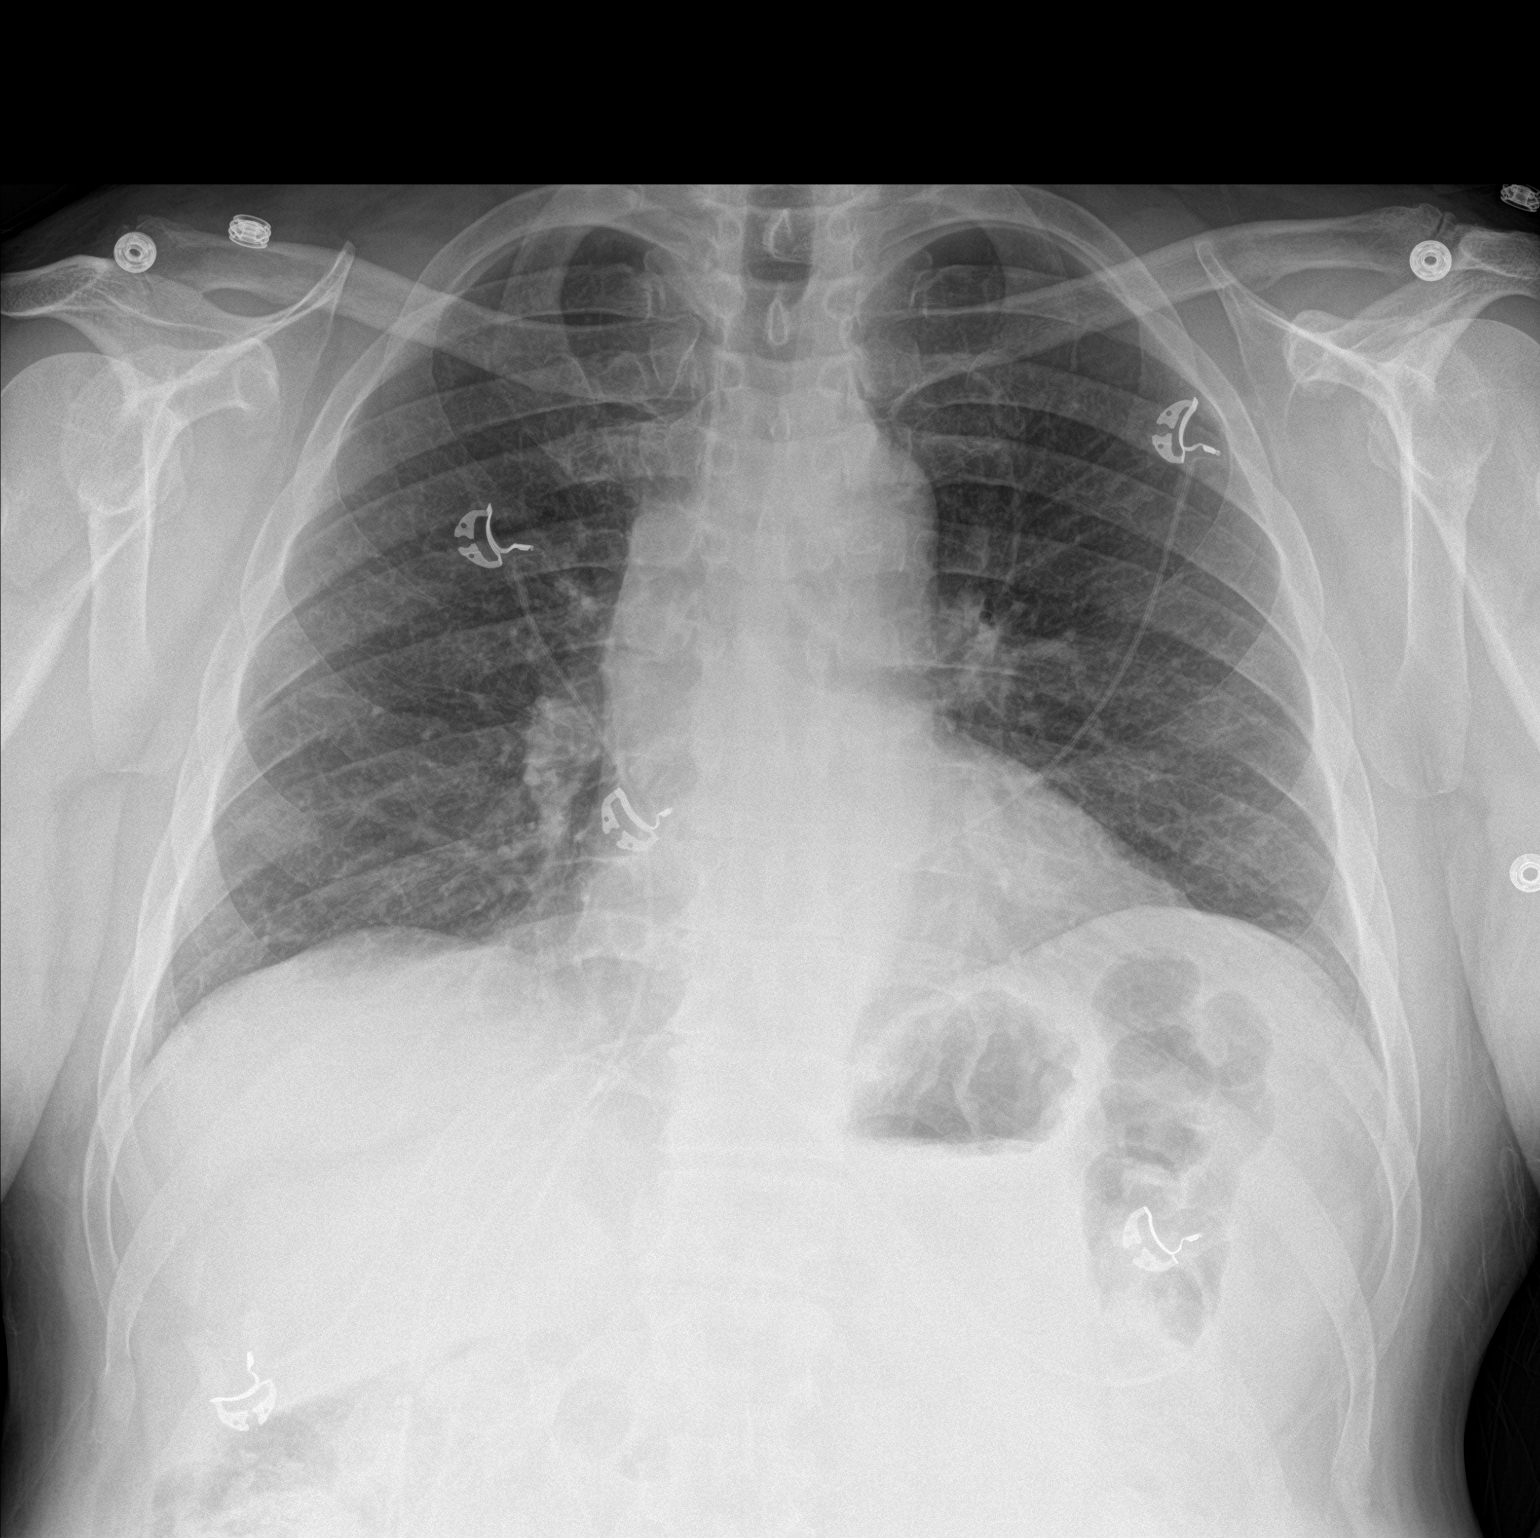

[chest lat]
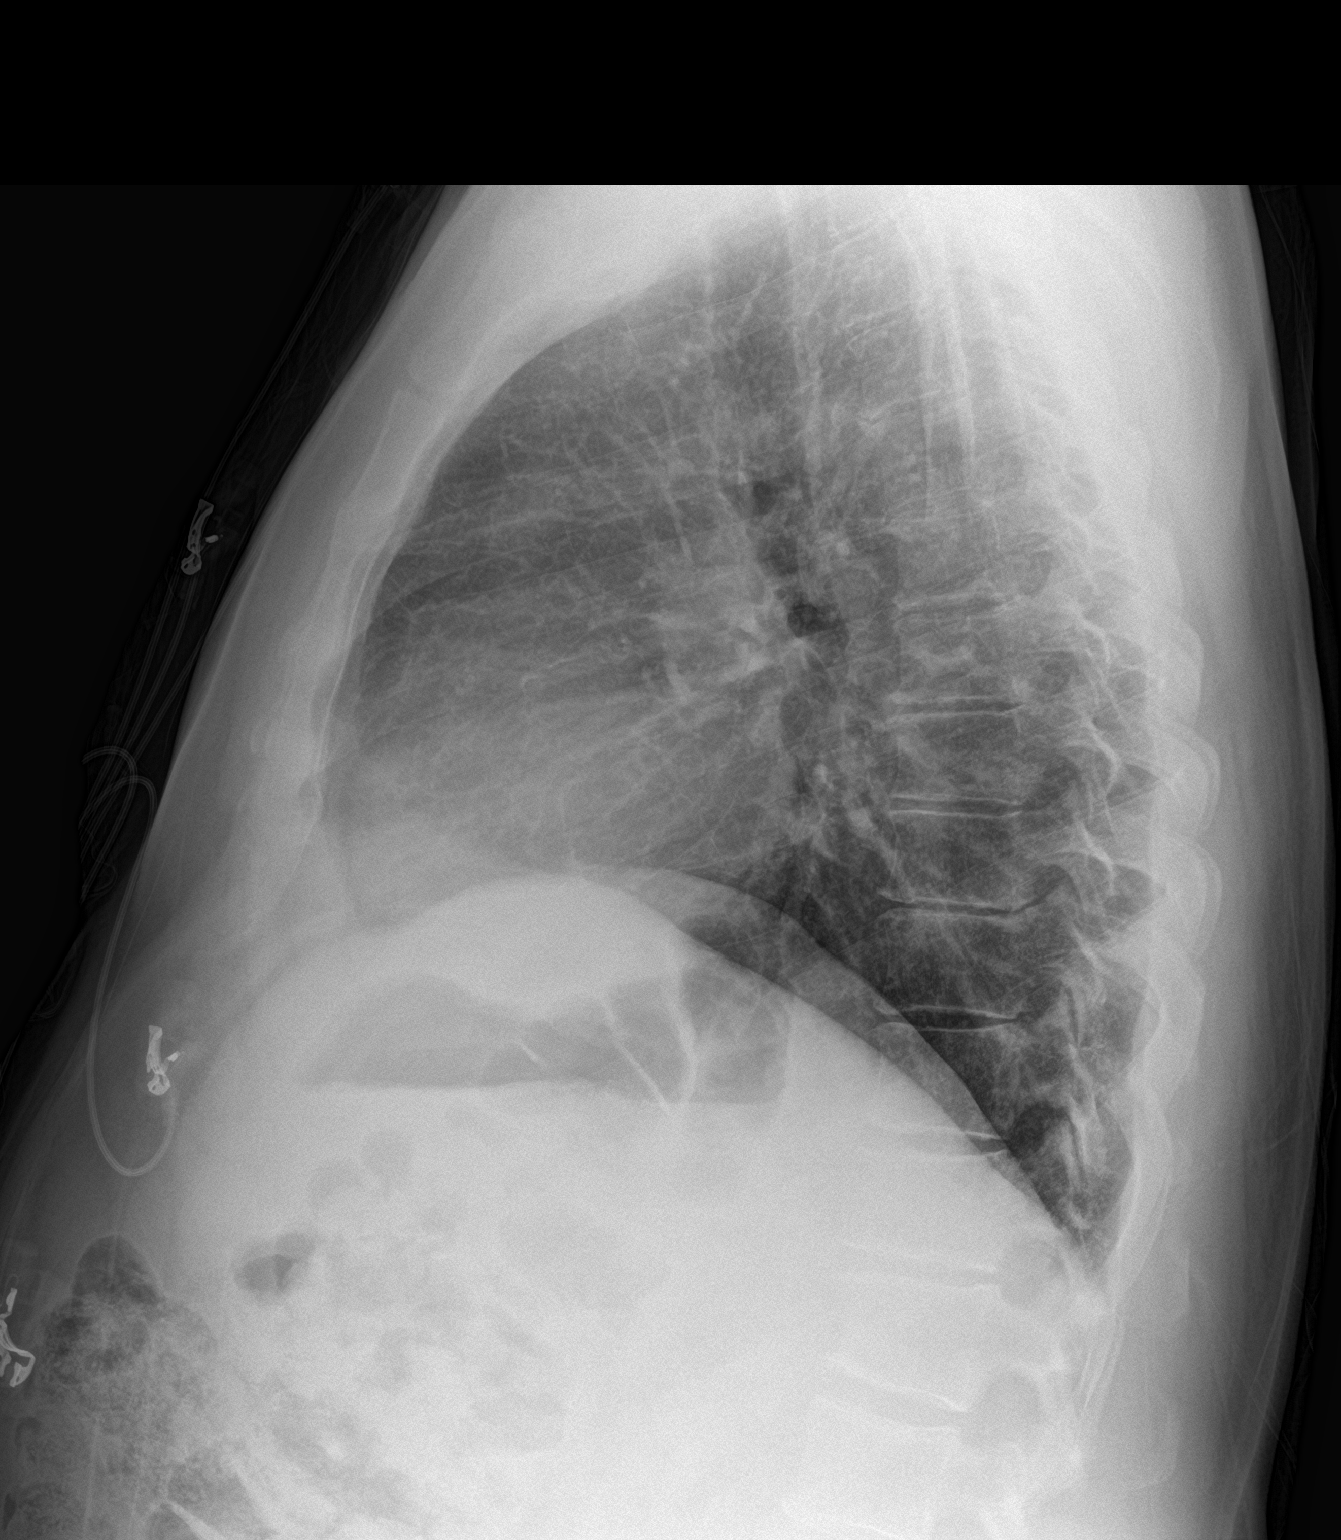

[2 of 2 positions shown; findings below may reference images not displayed]

FINDINGS: The lungs are well-aerated and clear. There is no evidence of focal
opacification, pleural effusion or pneumothorax.

The heart is normal in size; the mediastinal contour is within
normal limits. No acute osseous abnormalities are seen.
IMPRESSION: No acute cardiopulmonary process seen.

## 2018-10-27 IMAGING — CR DG CHEST 1V PORT
1 series · 1 of 1 positions shown · non-contrast
Comparison: 10/10/2016

CLINICAL DATA: Status post CABG

EXAM:
PORTABLE CHEST 1 VIEW

[AP]
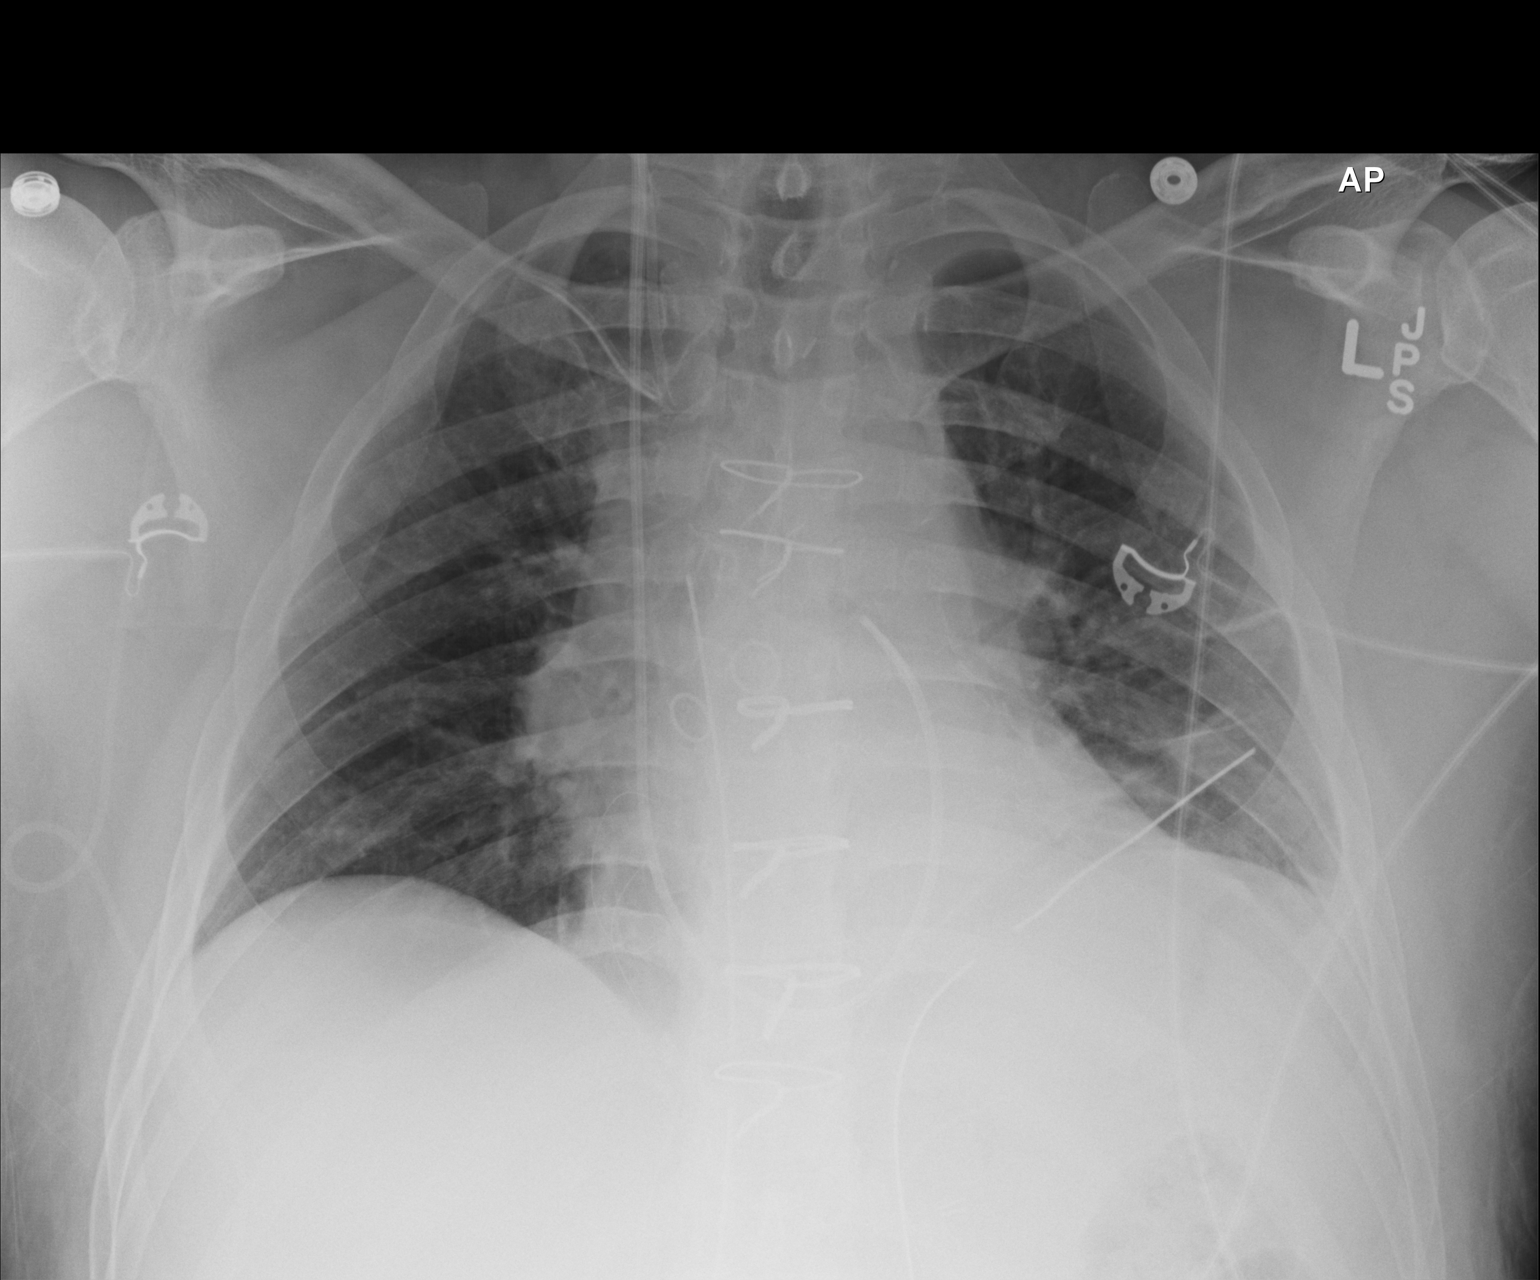

[1 of 1 positions shown; findings below may reference images not displayed]

FINDINGS: Status post extubation. The heart size appears normal. There is a
Swan-Ganz catheter at the right ventricular outflow tract.
Mediastinal drain and left chest tube identified. There is
atelectasis noted in the left base. No pneumothorax visualized. No
pleural effusion or edema.
IMPRESSION: 1. Support apparatus positioned as above.
2. Left base atelectasis.
3. No evidence for CHF.

## 2018-11-16 ENCOUNTER — Ambulatory Visit: Payer: BLUE CROSS/BLUE SHIELD | Admitting: Family Medicine

## 2018-12-01 ENCOUNTER — Other Ambulatory Visit: Payer: Self-pay | Admitting: Internal Medicine

## 2019-01-20 ENCOUNTER — Ambulatory Visit: Payer: BLUE CROSS/BLUE SHIELD | Admitting: Physician Assistant

## 2019-03-05 ENCOUNTER — Telehealth: Payer: Self-pay | Admitting: Physician Assistant

## 2019-03-05 DIAGNOSIS — I2510 Atherosclerotic heart disease of native coronary artery without angina pectoris: Secondary | ICD-10-CM

## 2019-03-05 NOTE — Telephone Encounter (Signed)
New Message:   Patient calling concerning a up coming appt. 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 Please call patient back.

## 2019-03-05 NOTE — Telephone Encounter (Signed)
Pt currently scheduled to see Richardson Dopp, PA-C on 5/6.  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  Will route to Dr. Harrington Challenger for review and advisement due to [place] being under COVID restrictions.

## 2019-03-08 NOTE — Telephone Encounter (Signed)
Shedule patient for GXT and visit with me in later part of may, hopefully office open  Can be same day Cancel appt with Kathleen Argue

## 2019-03-24 ENCOUNTER — Telehealth: Payer: Self-pay | Admitting: *Deleted

## 2019-03-24 NOTE — Telephone Encounter (Signed)
PT WAITING TO SEE DR ROSS IN LATE MAY APPT CANCELED WITH WEAVER 5-5 PER TE 4-10

## 2019-03-25 NOTE — Telephone Encounter (Signed)
Currently due to outpatient testing restrictions GXTs are not being scheduled.  Will route to Dr. Harrington Challenger to make aware I am holding off scheduling for right now, to see if we will be able to schedule a GXT in later part of May.  Will need visit with [place] around the same time.

## 2019-03-29 NOTE — Telephone Encounter (Signed)
WOuld set up for stress myovue later part of may  I will see him in clinic as well

## 2019-03-31 ENCOUNTER — Ambulatory Visit: Payer: BLUE CROSS/BLUE SHIELD | Admitting: Physician Assistant

## 2019-04-14 ENCOUNTER — Telehealth: Payer: Self-pay | Admitting: *Deleted

## 2019-04-14 ENCOUNTER — Telehealth (HOSPITAL_COMMUNITY): Payer: Self-pay | Admitting: *Deleted

## 2019-04-14 DIAGNOSIS — I2510 Atherosclerotic heart disease of native coronary artery without angina pectoris: Secondary | ICD-10-CM

## 2019-04-14 NOTE — Telephone Encounter (Signed)
Patient returned call stating he can't make this appt as he will be in [place] this Friday, he said he had discussed this with Dr. Harrington Challenger that is needed to be done the last week of May.  He would like a call back.

## 2019-04-14 NOTE — Telephone Encounter (Signed)
Patient given detailed instructions per Myocardial Perfusion Study Information Sheet for the test on 04/16/19 at 8:00. Patient notified to arrive 15 minutes early and that it is imperative to arrive on time for appointment to keep from having the test rescheduled.  If you need to cancel or reschedule your appointment, please call the office within 24 hours of your appointment. . Patient verbalized understanding.Joseph Jimenez

## 2019-04-14 NOTE — Telephone Encounter (Signed)
See phone encounter dated 04/14/19.

## 2019-04-14 NOTE — Telephone Encounter (Signed)
lexiscan scheduled for this Friday 04/16/19 at 8:00 am  Will see Dr. Harrington Challenger after, scheduled for 12:00 pm in office.  Left message for patient to call back to make sure he is aware/screening.

## 2019-04-14 NOTE — Telephone Encounter (Signed)
Spoke with patient. He will return from [facility] by Sunday. Available any day next week to have stress test/labs/visit with Dr. Harrington Challenger.  If possible not Tuesday.  He is aware we will plan to reschedule after checking openings for nuclear and Dr. Alan Ripper availability next week as she is not scheduled in clinic at this time.

## 2019-04-14 NOTE — Addendum Note (Signed)
Addended by: Rodman Key on: 04/14/2019 11:34 AM   Modules accepted: Orders

## 2019-04-15 ENCOUNTER — Encounter: Payer: Self-pay | Admitting: *Deleted

## 2019-04-15 NOTE — Telephone Encounter (Signed)
lexiscan has been scheduled for 04/21/19. Lab orders placed for same day (lipids, LFT, hgA1c, cbc, and bmet) Informed pt via my chart as he requested. Instruction letter for Lexi via [place].

## 2019-04-16 ENCOUNTER — Ambulatory Visit: Payer: BLUE CROSS/BLUE SHIELD | Admitting: Internal Medicine

## 2019-04-16 ENCOUNTER — Telehealth: Payer: Self-pay | Admitting: Internal Medicine

## 2019-04-16 ENCOUNTER — Encounter (HOSPITAL_COMMUNITY): Payer: BLUE CROSS/BLUE SHIELD

## 2019-04-16 ENCOUNTER — Telehealth (HOSPITAL_COMMUNITY): Payer: Self-pay | Admitting: *Deleted

## 2019-04-16 NOTE — Telephone Encounter (Signed)
Joseph Jimenez wants to know why he is having a nuclear stress test done instead of a regular stress test? His insurance only requires a exercise stress test. He wants to know if he can have the exercise stress test done.

## 2019-04-16 NOTE — Telephone Encounter (Signed)
Spoke with patient who states he is in [place] currently.  He stated that he has spoken to Dr. Harrington Challenger about his traveling which is job related. 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  Patient given detailed instructions per Myocardial Perfusion Study Information Sheet for the test on 04/21/19 at 11:00. Patient notified to arrive 15 minutes early and that it is imperative to arrive on time for appointment to keep from having the test rescheduled.  If you need to cancel or reschedule your appointment, please call the office within 24 hours of your appointment. . Patient verbalized understanding.Veronia Beets

## 2019-04-16 NOTE — Telephone Encounter (Signed)
Informed patient that at this time our office is only doing [place] Myoview due to [place] precautions and supply of testing for [place]  not widely available. Informed patient that lexiscan myoview will give him more detail than an exercise treadmill test. Even if this test is not necessary for the FDA, it is more then qualified to give them the results they need. Patient stated he would give FDA a call, and find out if this test can take the place of the regular exercise stress test. Patient agreed at this time he will keep his test appointment for next week, since his has to have it before the beginning of June.

## 2019-04-21 ENCOUNTER — Ambulatory Visit (HOSPITAL_COMMUNITY): Payer: BLUE CROSS/BLUE SHIELD | Attending: Cardiology

## 2019-04-21 ENCOUNTER — Other Ambulatory Visit: Payer: BLUE CROSS/BLUE SHIELD | Admitting: *Deleted

## 2019-04-21 ENCOUNTER — Encounter (HOSPITAL_COMMUNITY): Payer: Self-pay

## 2019-04-21 ENCOUNTER — Other Ambulatory Visit: Payer: Self-pay

## 2019-04-21 DIAGNOSIS — I2510 Atherosclerotic heart disease of native coronary artery without angina pectoris: Secondary | ICD-10-CM

## 2019-04-21 LAB — MYOCARDIAL PERFUSION IMAGING
LV dias vol: 81 mL (ref 62–150)
LV sys vol: 38 mL
Peak HR: 90 {beats}/min
Rest HR: 67 {beats}/min
SDS: 0
SRS: 0
SSS: 0
TID: 1.18

## 2019-04-21 MED ORDER — TECHNETIUM TC 99M TETROFOSMIN IV KIT
32.4000 | PACK | Freq: Once | INTRAVENOUS | Status: AC | PRN
  Administered 2019-04-21: 32.4 via INTRAVENOUS
  Filled 2019-04-21: qty 33

## 2019-04-21 MED ORDER — REGADENOSON 0.4 MG/5ML IV SOLN
0.4000 mg | Freq: Once | INTRAVENOUS | Status: AC
  Administered 2019-04-21: 0.4 mg via INTRAVENOUS

## 2019-04-21 MED ORDER — TECHNETIUM TC 99M TETROFOSMIN IV KIT
10.4000 | PACK | Freq: Once | INTRAVENOUS | Status: AC | PRN
  Administered 2019-04-21: 10.4 via INTRAVENOUS
  Filled 2019-04-21: qty 11

## 2019-04-22 ENCOUNTER — Encounter: Payer: Self-pay | Admitting: Internal Medicine

## 2019-04-22 ENCOUNTER — Other Ambulatory Visit: Payer: BLUE CROSS/BLUE SHIELD

## 2019-04-22 LAB — CBC
Hematocrit: 46.4 % (ref 37.5–51.0)
Hemoglobin: 15.9 g/dL (ref 13.0–17.7)
MCH: 30.3 pg (ref 26.6–33.0)
MCHC: 34.3 g/dL (ref 31.5–35.7)
MCV: 89 fL (ref 79–97)
Platelets: 202 10*3/uL (ref 150–450)
RBC: 5.24 x10E6/uL (ref 4.14–5.80)
RDW: 13.5 % (ref 11.6–15.4)
WBC: 9.8 10*3/uL (ref 3.4–10.8)

## 2019-04-22 LAB — LIPID PANEL
Chol/HDL Ratio: 2.3 ratio (ref 0.0–5.0)
Cholesterol, Total: 116 mg/dL (ref 100–199)
HDL: 51 mg/dL (ref >39)
LDL Calculated: 51 mg/dL (ref 0–99)
Triglycerides: 70 mg/dL (ref 0–149)
VLDL Cholesterol Cal: 14 mg/dL (ref 5–40)

## 2019-04-22 LAB — HEPATIC FUNCTION PANEL
ALT: 22 IU/L (ref 0–44)
AST: 30 IU/L (ref 0–40)
Albumin: 4.6 g/dL (ref 3.8–4.8)
Alkaline Phosphatase: 68 IU/L (ref 39–117)
Bilirubin Total: 0.5 mg/dL (ref 0.0–1.2)
Bilirubin, Direct: 0.16 mg/dL (ref 0.00–0.40)
Total Protein: 6.7 g/dL (ref 6.0–8.5)

## 2019-04-22 LAB — BASIC METABOLIC PANEL
BUN/Creatinine Ratio: 18 (ref 10–24)
BUN: 17 mg/dL (ref 8–27)
CO2: 25 mmol/L (ref 20–29)
Calcium: 9.3 mg/dL (ref 8.6–10.2)
Chloride: 102 mmol/L (ref 96–106)
Creatinine, Ser: 0.95 mg/dL (ref 0.76–1.27)
GFR calc Af Amer: 99 mL/min/{1.73_m2} (ref >59)
GFR calc non Af Amer: 85 mL/min/{1.73_m2} (ref >59)
Glucose: 121 mg/dL — ABNORMAL HIGH (ref 65–99)
Potassium: 4.7 mmol/L (ref 3.5–5.2)
Sodium: 140 mmol/L (ref 134–144)

## 2019-04-22 LAB — HEMOGLOBIN A1C
Est. average glucose Bld gHb Est-mCnc: 123 mg/dL
Hgb A1c MFr Bld: 5.9 % — ABNORMAL HIGH (ref 4.8–5.6)

## 2019-04-26 IMAGING — NM NM MISC PROCEDURE
6 series · 36 of 36 positions shown · non-contrast
Comparison: none

[Series 1: stress-sum-em · 6.40mm/px · 6 of 64 frames shown]
[frame 6/64]
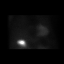
[frame 16/64]
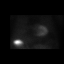
[frame 27/64]
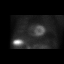
[frame 38/64]
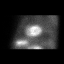
[frame 48/64]
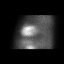
[frame 59/64]
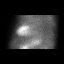

[Series 1: wbr_s-proj_st stress-gsp · 6.40mm/px · 6 of 512 frames shown]
[frame 43/512]
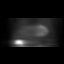
[frame 128/512]
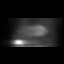
[frame 214/512]
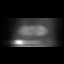
[frame 299/512]
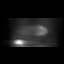
[frame 384/512]
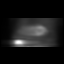
[frame 470/512]
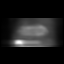

[Series 1: stress-gsp · 6.40mm/px · 6 of 512 frames shown]
[frame 43/512]
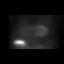
[frame 128/512]
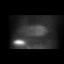
[frame 214/512]
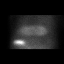
[frame 299/512]
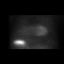
[frame 384/512]
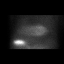
[frame 470/512]
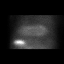

[Series 1: wbr_r-proj_st rest · 6.40mm/px · 6 of 64 frames shown]
[frame 6/64]
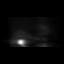
[frame 16/64]
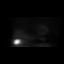
[frame 27/64]
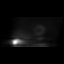
[frame 38/64]
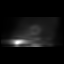
[frame 48/64]
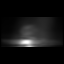
[frame 59/64]
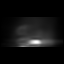

[Series 1: wbr_s-proj_st stress-sum-em · 6.40mm/px · 6 of 64 frames shown]
[frame 6/64]
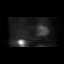
[frame 16/64]
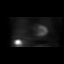
[frame 27/64]
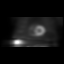
[frame 38/64]
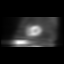
[frame 48/64]
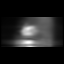
[frame 59/64]
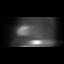

[Series 1: rest · 6.40mm/px · 6 of 64 frames shown]
[frame 6/64]
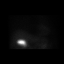
[frame 16/64]
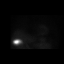
[frame 27/64]
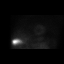
[frame 38/64]
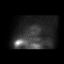
[frame 48/64]
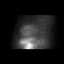
[frame 59/64]
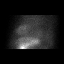

[36 of 36 positions shown; findings below may reference images not displayed]

Canned report from images found in remote index.

Refer to host system for actual result text.

## 2019-04-28 ENCOUNTER — Telehealth: Payer: Self-pay | Admitting: Internal Medicine

## 2019-04-28 NOTE — Telephone Encounter (Signed)
I have not called him. Will check with Dr. Harrington Challenger to see if she has reached out to him.

## 2019-04-28 NOTE — Telephone Encounter (Signed)
New message:    Patient stated that some one had called him and is returning the call back. Patient think is concering some paper work. Please call patient back.

## 2019-05-04 ENCOUNTER — Telehealth (INDEPENDENT_AMBULATORY_CARE_PROVIDER_SITE_OTHER): Payer: BC Managed Care – PPO | Admitting: Internal Medicine

## 2019-05-04 ENCOUNTER — Other Ambulatory Visit: Payer: Self-pay

## 2019-05-04 DIAGNOSIS — E7849 Other hyperlipidemia: Secondary | ICD-10-CM | POA: Diagnosis not present

## 2019-05-04 DIAGNOSIS — I10 Essential (primary) hypertension: Secondary | ICD-10-CM

## 2019-05-04 DIAGNOSIS — I2510 Atherosclerotic heart disease of native coronary artery without angina pectoris: Secondary | ICD-10-CM | POA: Diagnosis not present

## 2019-05-04 NOTE — Progress Notes (Signed)
Virtual Visit via Telephone Note   This visit type was conducted due to national recommendations for restrictions regarding the COVID-19 Pandemic (e.g. social distancing) in an effort to limit this patient's exposure and mitigate transmission in our community.  Due to his co-morbid illnesses, this patient is at least at moderate risk for complications without adequate follow up.  This format is felt to be most appropriate for this patient at this time.  The patient did not have access to video technology/had technical difficulties with video requiring transitioning to audio format only (telephone).  All issues noted in this document were discussed and addressed.  No physical exam could be performed with this format.  Please refer to the patient's chart for his  consent to telehealth for [facility].   Date:  05/04/2019   ID:  Joseph Jimenez, DOB 1956/07/24, MRN [id number]  Patient Location: Home Provider Location: Home   Cardiologist: Dorris Carnes, MD  Evaluation Performed:  Follow-Up Visit  Chief Complaint:  PT presents for f/u of CAD    History of Present Illness:    Joseph Jimenez is a 63 y.o. male with a hx of CAD   Cath in November 2017 (LAD 95% prox, 50% mid; D1 0stial 90%; D2 ostial 80%; LCx 85% mid; OM1 905 ; RCA 90% prox; 80% mid; LVEF normal)  He is  s/p CABG (L-LAD, S-Dx, L radial-OM, S-AM/LPDA) 09/2016.    He also has mild CV dz   He was last seen in cardiology clinic in May 2019  Since seen he has done very well   He remains very active.   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   Breathing is good   He denies dizziness  NO palpitations.     The patient does not have symptoms concerning for COVID-19 infection (fever, chills, cough, or new shortness of breath).    Past Medical History:  Diagnosis Date  . Carotid artery disease ([place])    Carotid US 11/17:Bilateral ICA 1-39  . Coronary artery disease    a. LHC 11/17 with 3 vessel  CAD, EF 55-65% // b. Status post CABG with Dr. Roxan Hockey 11/17 L-LAD, S-Dx, L radial-OM, S-AM/LPDA  . Heart murmur    "I've had it my whole life" (10/08/2016)  . High cholesterol   . History of kidney stones    "passed it" (10/08/2016)  . Prostate cancer ([place]) 2005   Past Surgical History:  Procedure Laterality Date  . CARDIAC CATHETERIZATION  10/08/2016  . CARDIAC CATHETERIZATION N/A 10/08/2016   Procedure: Left Heart Cath and Coronary Angiography;  Surgeon: Belva Crome, MD;  Location: [facility];  Service: Cardiovascular;  Laterality: N/A;  . COLONOSCOPY WITH PROPOFOL N/A 01/29/2016   Procedure: COLONOSCOPY WITH PROPOFOL;  Surgeon: Garlan Fair, MD;  [facility];  Service: Endoscopy;  Laterality: N/A;  . CORONARY ARTERY BYPASS GRAFT N/A 10/10/2016   Procedure: CORONARY ARTERY BYPASS GRAFTING (CABG);  Surgeon: Melrose Nakayama, MD;  Location: [facility];  Service: Open Heart Surgery;  Laterality: N/A;  Time 5  using left internal mammary artery, endoscopically harvested right saphenous vein, and left radial artery.  Consuela Mimes N/A 10/10/2016   Procedure: CYSTOSCOPY;  Surgeon: Melrose Nakayama, MD;  Location: [facility];  Service: Open Heart Surgery;  Laterality: N/A;  . INGUINAL HERNIA REPAIR Bilateral 1958   "before I was 61 months old ="  . LEFT  HEART CATH AND CORS/GRAFTS ANGIOGRAPHY N/A 04/15/2017   Procedure: Left Heart Cath and Cors/Grafts Angiography;  Surgeon: Belva Crome, MD;  Location: [facility];  Service: Cardiovascular;  Laterality: N/A;  . PROSTATE BIOPSY  2005  . PROSTATECTOMY  2005  . TEE WITHOUT CARDIOVERSION N/A 10/10/2016   Procedure: TRANSESOPHAGEAL ECHOCARDIOGRAM (TEE);  Surgeon: Melrose Nakayama, MD;  Location: [facility];  Service: Open Heart Surgery;  Laterality: N/A;     No outpatient medications have been marked as taking for the 05/04/19 encounter (Appointment) with Fay Records, MD.     Allergies:   Patient has no known  allergies.   Social History   Tobacco Use  . Smoking status: Former Smoker    Packs/day: 1.00    Years: 20.00    Pack years: 20.00    Types: Cigarettes    Last attempt to quit: 09/15/1998    Years since quitting: 20.6  . Smokeless tobacco: Never Used  Substance Use Topics  . Alcohol use: Yes    Comment: "quit drinking 09/15/1998"  . Drug use: Yes    Comment: 10/08/2016 "experimented in college; none since"     Family Hx: The patient's family history includes CAD in his mother; Obesity in his brother.  ROS:   Please see the history of present illness.     All other systems reviewed and are negative.   Prior CV studies:   The following studies were reviewed today:  Lexiscan Myovue   04/21/19  No ST changes during stress. LVEF 53% Normal study Low risk study    Labs/Other Tests and Data Reviewed:    EKG:  An ECG dated from [place] reviewed was personally reviewed today and demonstrated:      Recent Labs: 04/21/2019: ALT 22; BUN 17; Creatinine, Ser 0.95; Hemoglobin 15.9; Platelets 202; Potassium 4.7; Sodium 140   Recent Lipid Panel Lab Results  Component Value Date/Time   CHOL 116 04/21/2019 10:46 AM   TRIG 70 04/21/2019 10:46 AM   HDL 51 04/21/2019 10:46 AM   CHOLHDL 2.3 04/21/2019 10:46 AM   CHOLHDL 4.3 10/07/2016 08:16 AM   LDLCALC 51 04/21/2019 10:46 AM    Wt Readings from Last 3 Encounters:  04/21/19 212 lb (96.2 kg)  10/08/18 212 lb (96.2 kg)  03/27/18 214 lb 6.4 oz (97.3 kg)     Objective:    Vital Signs:    VITAL SIGNS:  reviewed  BP 145/65    P 66     ASSESSMENT & PLAN:    1. CAD  The pt is now 2 1/2 years post coronary artery bypass surgery.   He has done very well   He is very active.   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  I have set no limitations.  2   Hyperlipidemia  LIpid panel from 04/21/19 is  excellent with LDL of 51, HDL 51, triglycerides of 70.  Keep on same medicines  3  Hx hypertension. Blood pressure is 196/ systolic   I have asked patient to follow periodically  IT has not been high   Keep on same meds   4  COVID-19 Education: The signs and symptoms of COVID-19 were discussed with the patient and how to seek care for testing (follow up with PCP or arrange E-visit).  The importance of social distancing was  discussed today.  Time:   Today, I have spent 20  minutes with the patient with telehealth technology discussing the above problems.     Medication Adjustments/Labs and Tests Ordered: Current medicines are reviewed at length with the patient today.  Concerns regarding medicines are outlined above.   Tests Ordered: No orders of the defined types were placed in this encounter.   Medication Changes: No orders of the defined types were placed in this encounter.   Disposition:  Follow up 1 year  Signed, Dorris Carnes, MD  05/04/2019 1:03 PM    [place]

## 2019-05-24 ENCOUNTER — Other Ambulatory Visit: Payer: Self-pay | Admitting: Internal Medicine

## 2019-05-24 DIAGNOSIS — I2510 Atherosclerotic heart disease of native coronary artery without angina pectoris: Secondary | ICD-10-CM

## 2019-05-24 DIAGNOSIS — E785 Hyperlipidemia, unspecified: Secondary | ICD-10-CM

## 2019-08-30 DIAGNOSIS — D2372 Other benign neoplasm of skin of left lower limb, including hip: Secondary | ICD-10-CM | POA: Diagnosis not present

## 2019-08-30 DIAGNOSIS — L821 Other seborrheic keratosis: Secondary | ICD-10-CM | POA: Diagnosis not present

## 2019-08-30 DIAGNOSIS — L812 Freckles: Secondary | ICD-10-CM | POA: Diagnosis not present

## 2019-08-30 DIAGNOSIS — L570 Actinic keratosis: Secondary | ICD-10-CM | POA: Diagnosis not present

## 2020-02-16 DIAGNOSIS — Z03818 Encounter for observation for suspected exposure to other biological agents ruled out: Secondary | ICD-10-CM | POA: Diagnosis not present

## 2020-02-16 DIAGNOSIS — R5383 Other fatigue: Secondary | ICD-10-CM | POA: Diagnosis not present

## 2020-02-16 DIAGNOSIS — M791 Myalgia, unspecified site: Secondary | ICD-10-CM | POA: Diagnosis not present

## 2020-02-16 DIAGNOSIS — Z20828 Contact with and (suspected) exposure to other viral communicable diseases: Secondary | ICD-10-CM | POA: Diagnosis not present

## 2020-02-17 ENCOUNTER — Telehealth: Payer: Self-pay | Admitting: Internal Medicine

## 2020-02-17 MED ORDER — AZITHROMYCIN 500 MG PO TABS: 500.0000 mg | ORAL_TABLET | Freq: Every day | ORAL | 0 refills | Status: AC

## 2020-02-17 NOTE — Telephone Encounter (Signed)
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  REcomm:   Azithromycin 500 mg daily for 3 days   If does not resolve or gets worse go to urgent care   Stay hydrated   If cannot keep fluids and dehydrated go to ED.

## 2020-02-17 NOTE — Addendum Note (Signed)
Addended by: Rodman Key on: 02/17/2020 09:48 AM   Modules accepted: Orders

## 2020-02-17 NOTE — Telephone Encounter (Signed)
Order placed for azithromycin 500 mg daily x 3 days.

## 2020-02-18 DIAGNOSIS — R197 Diarrhea, unspecified: Secondary | ICD-10-CM | POA: Diagnosis not present

## 2020-02-21 ENCOUNTER — Other Ambulatory Visit: Payer: Self-pay

## 2020-02-21 ENCOUNTER — Telehealth: Payer: Self-pay | Admitting: Internal Medicine

## 2020-02-21 ENCOUNTER — Other Ambulatory Visit: Payer: BC Managed Care – PPO | Admitting: *Deleted

## 2020-02-21 DIAGNOSIS — R197 Diarrhea, unspecified: Secondary | ICD-10-CM

## 2020-02-21 DIAGNOSIS — K51211 Ulcerative (chronic) proctitis with rectal bleeding: Secondary | ICD-10-CM | POA: Diagnosis not present

## 2020-02-21 DIAGNOSIS — K921 Melena: Secondary | ICD-10-CM

## 2020-02-21 DIAGNOSIS — R1032 Left lower quadrant pain: Secondary | ICD-10-CM | POA: Diagnosis not present

## 2020-02-21 NOTE — Telephone Encounter (Signed)
Orders entered .Joseph Jimenez

## 2020-02-21 NOTE — Addendum Note (Signed)
Addended by: Devra Dopp E on: 02/21/2020 11:47 AM   Modules accepted: Orders

## 2020-02-21 NOTE — Telephone Encounter (Signed)
Spoke to wife   Pt is still having diarrhea  6 to 8 x per day  Some blood but she said she thiinks probably from so much irriation. Pt drinking some fluids    Diarrhea 30 to 45 min after eating   Discussed with M Magod  Pt to go to his office for a stool sample today  WOuld like CBC with differential as well as a BMET   Please put in for pt to have today

## 2020-02-22 DIAGNOSIS — R197 Diarrhea, unspecified: Secondary | ICD-10-CM | POA: Diagnosis not present

## 2020-02-23 LAB — CBC WITH DIFFERENTIAL/PLATELET
Basophils Absolute: 0.1 10*3/uL (ref 0.0–0.2)
Basos: 1 %
EOS (ABSOLUTE): 0.3 10*3/uL (ref 0.0–0.4)
Eos: 3 %
Hematocrit: 42.9 % (ref 37.5–51.0)
Hemoglobin: 14.9 g/dL (ref 13.0–17.7)
Immature Grans (Abs): 0.3 10*3/uL — ABNORMAL HIGH (ref 0.0–0.1)
Immature Granulocytes: 2 %
Lymphocytes Absolute: 2.5 10*3/uL (ref 0.7–3.1)
Lymphs: 19 %
MCH: 30.1 pg (ref 26.6–33.0)
MCHC: 34.7 g/dL (ref 31.5–35.7)
MCV: 87 fL (ref 79–97)
Monocytes Absolute: 1.4 10*3/uL — ABNORMAL HIGH (ref 0.1–0.9)
Monocytes: 11 %
Neutrophils Absolute: 8.5 10*3/uL — ABNORMAL HIGH (ref 1.4–7.0)
Neutrophils: 64 %
Platelets: 317 10*3/uL (ref 150–450)
RBC: 4.95 x10E6/uL (ref 4.14–5.80)
RDW: 13.1 % (ref 11.6–15.4)
WBC: 13.2 10*3/uL — ABNORMAL HIGH (ref 3.4–10.8)

## 2020-02-23 LAB — BASIC METABOLIC PANEL
BUN/Creatinine Ratio: 7 — ABNORMAL LOW (ref 10–24)
BUN: 7 mg/dL — ABNORMAL LOW (ref 8–27)
CO2: 30 mmol/L — ABNORMAL HIGH (ref 20–29)
Calcium: 9 mg/dL (ref 8.6–10.2)
Chloride: 93 mmol/L — ABNORMAL LOW (ref 96–106)
Creatinine, Ser: 1.03 mg/dL (ref 0.76–1.27)
GFR calc Af Amer: 89 mL/min/{1.73_m2} (ref >59)
GFR calc non Af Amer: 77 mL/min/{1.73_m2} (ref >59)
Glucose: 105 mg/dL — ABNORMAL HIGH (ref 65–99)
Potassium: 3.9 mmol/L (ref 3.5–5.2)
Sodium: 139 mmol/L (ref 134–144)

## 2020-03-16 ENCOUNTER — Other Ambulatory Visit: Payer: Self-pay | Admitting: Internal Medicine

## 2020-03-16 DIAGNOSIS — E785 Hyperlipidemia, unspecified: Secondary | ICD-10-CM

## 2020-03-16 DIAGNOSIS — I2510 Atherosclerotic heart disease of native coronary artery without angina pectoris: Secondary | ICD-10-CM

## 2020-03-20 NOTE — Telephone Encounter (Signed)
Order for GXT placed.

## 2020-03-23 NOTE — Telephone Encounter (Signed)
Lab orders entered for BMET, CBC, LIPIDS/LIVER and A1c.

## 2020-04-14 ENCOUNTER — Other Ambulatory Visit: Payer: BC Managed Care – PPO | Admitting: *Deleted

## 2020-04-14 ENCOUNTER — Other Ambulatory Visit: Payer: Self-pay

## 2020-04-14 DIAGNOSIS — I2510 Atherosclerotic heart disease of native coronary artery without angina pectoris: Secondary | ICD-10-CM | POA: Diagnosis not present

## 2020-04-15 LAB — CBC
Hematocrit: 46.4 % (ref 37.5–51.0)
Hemoglobin: 15.6 g/dL (ref 13.0–17.7)
MCH: 29.7 pg (ref 26.6–33.0)
MCHC: 33.6 g/dL (ref 31.5–35.7)
MCV: 88 fL (ref 79–97)
Platelets: 206 10*3/uL (ref 150–450)
RBC: 5.25 x10E6/uL (ref 4.14–5.80)
RDW: 13.8 % (ref 11.6–15.4)
WBC: 9.4 10*3/uL (ref 3.4–10.8)

## 2020-04-15 LAB — BASIC METABOLIC PANEL
BUN/Creatinine Ratio: 16 (ref 10–24)
BUN: 17 mg/dL (ref 8–27)
CO2: 24 mmol/L (ref 20–29)
Calcium: 9 mg/dL (ref 8.6–10.2)
Chloride: 106 mmol/L (ref 96–106)
Creatinine, Ser: 1.07 mg/dL (ref 0.76–1.27)
GFR calc Af Amer: 85 mL/min/{1.73_m2} (ref >59)
GFR calc non Af Amer: 73 mL/min/{1.73_m2} (ref >59)
Glucose: 116 mg/dL — ABNORMAL HIGH (ref 65–99)
Potassium: 5.5 mmol/L — ABNORMAL HIGH (ref 3.5–5.2)
Sodium: 140 mmol/L (ref 134–144)

## 2020-04-15 LAB — LIPID PANEL
Chol/HDL Ratio: 2.4 ratio (ref 0.0–5.0)
Cholesterol, Total: 120 mg/dL (ref 100–199)
HDL: 51 mg/dL (ref >39)
LDL Chol Calc (NIH): 53 mg/dL (ref 0–99)
Triglycerides: 83 mg/dL (ref 0–149)
VLDL Cholesterol Cal: 16 mg/dL (ref 5–40)

## 2020-04-15 LAB — HEPATIC FUNCTION PANEL
ALT: 18 IU/L (ref 0–44)
AST: 30 IU/L (ref 0–40)
Albumin: 4.6 g/dL (ref 3.8–4.8)
Alkaline Phosphatase: 64 IU/L (ref 48–121)
Bilirubin Total: 0.5 mg/dL (ref 0.0–1.2)
Bilirubin, Direct: 0.16 mg/dL (ref 0.00–0.40)
Total Protein: 7.1 g/dL (ref 6.0–8.5)

## 2020-04-15 LAB — HEMOGLOBIN A1C
Est. average glucose Bld gHb Est-mCnc: 117 mg/dL
Hgb A1c MFr Bld: 5.7 % — ABNORMAL HIGH (ref 4.8–5.6)

## 2020-04-21 ENCOUNTER — Other Ambulatory Visit (HOSPITAL_COMMUNITY)
Admission: RE | Admit: 2020-04-21 | Discharge: 2020-04-21 | Disposition: A | Discharge status: Home / Self Care | Payer: BC Managed Care – PPO | Source: Ambulatory Visit | Attending: Internal Medicine | Admitting: Internal Medicine

## 2020-04-21 DIAGNOSIS — Z20822 Contact with and (suspected) exposure to covid-19: Secondary | ICD-10-CM | POA: Insufficient documentation

## 2020-04-21 DIAGNOSIS — Z01812 Encounter for preprocedural laboratory examination: Secondary | ICD-10-CM | POA: Diagnosis not present

## 2020-04-21 LAB — SARS CORONAVIRUS 2 (TAT 6-24 HRS): SARS Coronavirus 2: NEGATIVE

## 2020-04-25 ENCOUNTER — Ambulatory Visit (INDEPENDENT_AMBULATORY_CARE_PROVIDER_SITE_OTHER): Payer: BC Managed Care – PPO

## 2020-04-25 ENCOUNTER — Other Ambulatory Visit: Payer: Self-pay

## 2020-04-25 ENCOUNTER — Encounter: Payer: Self-pay | Admitting: Nurse Practitioner

## 2020-04-25 DIAGNOSIS — I2510 Atherosclerotic heart disease of native coronary artery without angina pectoris: Secondary | ICD-10-CM | POA: Diagnosis not present

## 2020-04-25 LAB — EXERCISE TOLERANCE TEST
Estimated workload: 11.6 METS
Exercise duration (min): 9 min
Exercise duration (sec): 28 s
MPHR: 157 {beats}/min
Peak HR: 160 {beats}/min
Percent HR: 101 %
RPE: 15
Rest HR: 75 {beats}/min

## 2020-04-27 ENCOUNTER — Encounter: Payer: Self-pay | Admitting: Physician Assistant

## 2020-04-27 ENCOUNTER — Other Ambulatory Visit: Payer: Self-pay

## 2020-04-27 ENCOUNTER — Encounter: Payer: Self-pay | Admitting: *Deleted

## 2020-04-27 ENCOUNTER — Ambulatory Visit: Payer: BC Managed Care – PPO | Admitting: Physician Assistant

## 2020-04-27 VITALS — BP 126/78 | HR 76 | Ht 71.0 in | Wt 213.0 lb

## 2020-04-27 DIAGNOSIS — I2510 Atherosclerotic heart disease of native coronary artery without angina pectoris: Secondary | ICD-10-CM

## 2020-04-27 DIAGNOSIS — E7849 Other hyperlipidemia: Secondary | ICD-10-CM | POA: Diagnosis not present

## 2020-04-27 NOTE — Patient Instructions (Addendum)
Medication Instructions:  Your physician recommends that you continue on your current medications as directed. Please refer to the Current Medication list given to you today.  *If you need a refill on your cardiac medications before your next appointment, please call your pharmacy*   Lab Work: None ordered  If you have labs (blood work) drawn today and your tests are completely normal, you will receive your results only by: Marland Kitchen MyChart Message (if you have MyChart) OR . A paper copy in the mail If you have any lab test that is abnormal or we need to change your treatment, we will call you to review the results.   Testing/Procedures: None ordered   Follow-Up: At [facility], you and your health needs are our priority.  As part of our continuing mission to provide you with exceptional heart care, we have created designated Provider Care Teams.  These Care Teams include your primary Cardiologist (physician) and Advanced Practice Providers (APPs -  Physician Assistants and Nurse Practitioners) who all work together to provide you with the care you need, when you need it.  We recommend signing up for the patient portal called "MyChart".  Sign up information is provided on this After Visit Summary.  MyChart is used to connect with patients for Virtual Visits (Telemedicine).  Patients are able to view lab/test results, encounter notes, upcoming appointments, etc.  Non-urgent messages can be sent to your provider as well.   To learn more about what you can do with MyChart, go to [email or web address].    Your next appointment:   12 month(s)  The format for your next appointment:   In Person  Provider:   You may see Dr. Dorris Carnes or one of the following Advanced Practice Providers on your designated Care Team:    Richardson Dopp, PA-C  Robbie Lis, [place]    Other Instructions

## 2020-04-27 NOTE — Progress Notes (Signed)
Cardiology Office Note    Date:  04/27/2020   ID:  Joseph Jimenez, DOB 02-21-1956, MRN [id number]  PCP:  Patient, No Pcp Per  Cardiologist:  Dr. Harrington Challenger  Chief Complaint:  CAD follow up  History of Present Illness:   Joseph Jimenez is a 64 y.o. male with hx of  CAD s/p CABG (L-LAD, S-Dx, L radial-OM, S-AM/LPDA) 09/2016 and HLD seen for follow up.   Exercise tolerance test 04/25/2020:  Blood pressure demonstrated a hypertensive response to exercise.  There was no ST segment deviation noted during stress.  No T wave inversion was noted during stress.   1. Negative ECG treadmill stress test for ischemia.  2. Occasional PVCs noted with peak stress.  3. Hypertensive response to exercise (221/83).  4. Normal heart rate response to exercise.  5. Good exercise capacity (9:28 m:s; 11.6 METS).  6. This is a low risk study.   He was cleared to fly by Dr. Harrington Challenger. Here today for follow up.  He plays tennis and golf without any cardiac symptoms.  He flies Hotel manager.  He denies chest pain, shortness of breath, orthopnea, PND, syncope, lower extremity edema or melena.   Past Medical History:  Diagnosis Date  . Carotid artery disease ([place])    Carotid US 11/17:Bilateral ICA 1-39  . Coronary artery disease    a. LHC 11/17 with 3 vessel CAD, EF 55-65% // b. Status post CABG with Dr. Roxan Hockey 11/17 L-LAD, S-Dx, L radial-OM, S-AM/LPDA  . Heart murmur    "I've had it my whole life" (10/08/2016)  . High cholesterol   . History of kidney stones    "passed it" (10/08/2016)  . Prostate cancer ([place]) 2005    Past Surgical History:  Procedure Laterality Date  . CARDIAC CATHETERIZATION  10/08/2016  . CARDIAC CATHETERIZATION N/A 10/08/2016   Procedure: Left Heart Cath and Coronary Angiography;  Surgeon: Belva Crome, MD;  Location: [place] [facility];  Service: Cardiovascular;  Laterality: N/A;  . COLONOSCOPY WITH PROPOFOL N/A 01/29/2016   Procedure: COLONOSCOPY WITH  PROPOFOL;  Surgeon: Garlan Fair, MD;  [facility];  Service: Endoscopy;  Laterality: N/A;  . CORONARY ARTERY BYPASS GRAFT N/A 10/10/2016   Procedure: CORONARY ARTERY BYPASS GRAFTING (CABG);  Surgeon: Melrose Nakayama, MD;  Location: [place];  Service: Open Heart Surgery;  Laterality: N/A;  Time 5  using left internal mammary artery, endoscopically harvested right saphenous vein, and left radial artery.  Consuela Mimes N/A 10/10/2016   Procedure: CYSTOSCOPY;  Surgeon: Melrose Nakayama, MD;  Location: [place];  Service: Open Heart Surgery;  Laterality: N/A;  . INGUINAL HERNIA REPAIR Bilateral 1958   "before I was 31 months old ="  . LEFT HEART CATH AND CORS/GRAFTS ANGIOGRAPHY N/A 04/15/2017   Procedure: Left Heart Cath and Cors/Grafts Angiography;  Surgeon: Belva Crome, MD;  Location: [place] [facility];  Service: Cardiovascular;  Laterality: N/A;  . PROSTATE BIOPSY  2005  . PROSTATECTOMY  2005  . TEE WITHOUT CARDIOVERSION N/A 10/10/2016   Procedure: TRANSESOPHAGEAL ECHOCARDIOGRAM (TEE);  Surgeon: Melrose Nakayama, MD;  Location: [place];  Service: Open Heart Surgery;  Laterality: N/A;    Current Medications: Prior to Admission medications   Medication Sig Start Date End Date Taking? Authorizing Provider  aspirin EC 81 MG tablet Take 1 tablet (81 mg total) by mouth daily. 04/25/17   Fay Records, MD  ezetimibe (ZETIA) 10 MG tablet TAKE 1 TABLET  BY MOUTH EVERY DAY 03/17/20   Fay Records, MD  rosuvastatin (CRESTOR) 40 MG tablet TAKE 1 TABLET BY MOUTH EVERY DAY 05/24/19   Fay Records, MD    Allergies:   Patient has no known allergies.   Social History   Socioeconomic History  . Marital status: Married    Spouse name: Not on file  . Number of children: Not on file  . Years of education: Not on file  . Highest education level: Not on file  Occupational History  . Not on file  Tobacco Use  . Smoking status: Former Smoker    Packs/day: 1.00    Years: 20.00    Pack  years: 20.00    Types: Cigarettes    Quit date: 09/15/1998    Years since quitting: 21.6  . Smokeless tobacco: Never Used  Substance and Sexual Activity  . Alcohol use: Yes    Comment: "quit drinking 09/15/1998"  . Drug use: Yes    Comment: 10/08/2016 "experimented in college; none since"  . Sexual activity: Yes  Other Topics Concern  . Not on file  Social History Narrative  . Not on file   Social Determinants of Health   Financial Resource Strain:   . Difficulty of Paying Living Expenses:   Food Insecurity:   . Worried About Charity fundraiser in the Last Year:   . Arboriculturist in the Last Year:   Transportation Needs:   . Film/video editor (Medical):   Marland Kitchen Lack of Transportation (Non-Medical):   Physical Activity:   . Days of Exercise per Week:   . Minutes of Exercise per Session:   Stress:   . Feeling of Stress :   Social Connections:   . Frequency of Communication with Friends and Family:   . Frequency of Social Gatherings with Friends and Family:   . Attends Religious Services:   . Active Member of Clubs or Organizations:   . Attends Archivist Meetings:   Marland Kitchen Marital Status:      Family History:  The patient's family history includes CAD in his mother; Obesity in his brother.   ROS:   Please see the history of present illness.    ROS All other systems reviewed and are negative.   PHYSICAL EXAM:   VS:  BP 126/78   Pulse 76   Ht 5\' 11"  (1.803 m)   Wt 213 lb (96.6 kg)   SpO2 95%   BMI 29.71 kg/m    GEN: Well nourished, well developed, in no acute distress  HEENT: normal  Neck: no JVD, carotid bruits, or masses Cardiac: RRR; no murmurs, rubs, or gallops,no edema  Respiratory:  clear to auscultation bilaterally, normal work of breathing GI: soft, nontender, nondistended, + BS MS: no deformity or atrophy  Skin: warm and dry, no rash Neuro:  Alert and Oriented x 3, Strength and sensation are intact Psych: euthymic mood, full affect  Wt  Readings from Last 3 Encounters:  04/27/20 213 lb (96.6 kg)  04/21/19 212 lb (96.2 kg)  10/08/18 212 lb (96.2 kg)      Studies/Labs Reviewed:   EKG:  EKG is ordered today.  The ekg ordered today demonstrates normal sinus rhythm  Recent Labs: 04/14/2020: ALT 18; BUN 17; Creatinine, Ser 1.07; Hemoglobin 15.6; Platelets 206; Potassium 5.5; Sodium 140   Lipid Panel    Component Value Date/Time   CHOL 120 04/14/2020 0745   TRIG 83 04/14/2020 0745   HDL  51 04/14/2020 0745   CHOLHDL 2.4 04/14/2020 0745   CHOLHDL 4.3 10/07/2016 0816   VLDL 21 10/07/2016 0816   LDLCALC 53 04/14/2020 0745    Additional studies/ records that were reviewed today include:   As above     ASSESSMENT & PLAN:    1. CAD s/p CABG -No anginal symptoms.  EKG normal.  Continue aspirin and statin.  2. HLD -LDL excellently controlled.  Continue Zetia and Crestor.   Medication Adjustments/Labs and Tests Ordered: Current medicines are reviewed at length with the patient today.  Concerns regarding medicines are outlined above.  Medication changes, Labs and Tests ordered today are listed in the Patient Instructions below. Patient Instructions  Medication Instructions:  Your physician recommends that you continue on your current medications as directed. Please refer to the Current Medication list given to you today.  *If you need a refill on your cardiac medications before your next appointment, please call your pharmacy*   Lab Work: None ordered  If you have labs (blood work) drawn today and your tests are completely normal, you will receive your results only by: Marland Kitchen MyChart Message (if you have MyChart) OR . A paper copy in the mail If you have any lab test that is abnormal or we need to change your treatment, we will call you to review the results.   Testing/Procedures: None ordered   Follow-Up: At [facility], you and your health needs are our priority.  As part of our continuing mission to  provide you with exceptional heart care, we have created designated Provider Care Teams.  These Care Teams include your primary Cardiologist (physician) and Advanced Practice Providers (APPs -  Physician Assistants and Nurse Practitioners) who all work together to provide you with the care you need, when you need it.  We recommend signing up for the patient portal called "MyChart".  Sign up information is provided on this After Visit Summary.  MyChart is used to connect with patients for Virtual Visits (Telemedicine).  Patients are able to view lab/test results, encounter notes, upcoming appointments, etc.  Non-urgent messages can be sent to your provider as well.   To learn more about what you can do with MyChart, go to [email or web address].    Your next appointment:   12 month(s)  The format for your next appointment:   In Person  Provider:   You may see Dr. Dorris Carnes or one of the following Advanced Practice Providers on your designated Care Team:    Richardson Dopp, PA-C  Robbie Lis, PA-C    Other Instructions      Signed, Leanor Kail, [place]  04/27/2020 12:12 PM    [address], [place], [place]  [address] Phone: [phone]; Fax: [phone]

## 2020-05-04 ENCOUNTER — Telehealth: Payer: Self-pay | Admitting: Internal Medicine

## 2020-05-04 NOTE — Telephone Encounter (Signed)
Dr. Alan Ripper nurse Caren Hazy who was working remote today had asked for my assistance today in getting notes emailed per the pt request. See previous notes.   Per Michalene I emailed 04/27/20 ov note from [place], [place] and 04/25/20 [facility]. I have emailed these records to email given by the pt.  [email or web address] attn: Dr. Raeford Razor

## 2020-05-04 NOTE — Telephone Encounter (Signed)
New Message  Pt called and was stating that he needs the clinical notes from [facility] and also narrative interpretative report of stress test results from Dr. Harrington Challenger. He said to email to [email or web address] and attn to Dr. Raeford Razor. He mentioned that he needs it done asap if possible. Please call to verify

## 2020-05-15 ENCOUNTER — Telehealth: Payer: Self-pay | Admitting: Internal Medicine

## 2020-05-15 NOTE — Telephone Encounter (Signed)
Patient is requesting to have coding for stool sample from gastroenterologist resubmitted. He states stool sample is not covered financially due to how it was submitted. Please assist.

## 2020-05-15 NOTE — Telephone Encounter (Signed)
Left VM on patient's self identified cell phone that if his stool speciman was done at [facility], he would need to contact them to update the diagnosis code and resubmit.  Adv to call back if we can be of any further assistance.

## 2020-05-15 NOTE — Telephone Encounter (Signed)
Will send call to primary card nurse. Though I believe this may be a call for GI per reading the notes from the operator.

## 2020-06-08 ENCOUNTER — Other Ambulatory Visit: Payer: Self-pay | Admitting: Internal Medicine

## 2020-06-08 DIAGNOSIS — E785 Hyperlipidemia, unspecified: Secondary | ICD-10-CM

## 2020-06-08 DIAGNOSIS — I2510 Atherosclerotic heart disease of native coronary artery without angina pectoris: Secondary | ICD-10-CM

## 2020-06-11 ENCOUNTER — Other Ambulatory Visit: Payer: Self-pay | Admitting: Internal Medicine

## 2020-09-04 DIAGNOSIS — L570 Actinic keratosis: Secondary | ICD-10-CM | POA: Diagnosis not present

## 2020-09-04 DIAGNOSIS — D1801 Hemangioma of skin and subcutaneous tissue: Secondary | ICD-10-CM | POA: Diagnosis not present

## 2020-09-04 DIAGNOSIS — L821 Other seborrheic keratosis: Secondary | ICD-10-CM | POA: Diagnosis not present

## 2020-10-09 ENCOUNTER — Other Ambulatory Visit: Payer: Self-pay

## 2020-10-09 ENCOUNTER — Ambulatory Visit: Payer: BC Managed Care – PPO | Admitting: Internal Medicine

## 2020-10-09 ENCOUNTER — Encounter: Payer: Self-pay | Admitting: *Deleted

## 2020-10-09 ENCOUNTER — Encounter: Payer: Self-pay | Admitting: Internal Medicine

## 2020-10-09 VITALS — BP 140/78 | HR 93 | Ht 71.0 in | Wt 214.4 lb

## 2020-10-09 DIAGNOSIS — E782 Mixed hyperlipidemia: Secondary | ICD-10-CM | POA: Diagnosis not present

## 2020-10-09 DIAGNOSIS — I2510 Atherosclerotic heart disease of native coronary artery without angina pectoris: Secondary | ICD-10-CM | POA: Diagnosis not present

## 2020-10-09 NOTE — Patient Instructions (Signed)
Medication Instructions:  No changes *If you need a refill on your cardiac medications before your next appointment, please call your pharmacy*  Lab Work: none If you have labs (blood work) drawn today and your tests are completely normal, you will receive your results only by: Marland Kitchen MyChart Message (if you have MyChart) OR . A paper copy in the mail If you have any lab test that is abnormal or we need to change your treatment, we will call you to review the results.   Testing/Procedures: None   Follow-Up: At [facility], you and your health needs are our priority.  As part of our continuing mission to provide you with exceptional heart care, we have created designated Provider Care Teams.  These Care Teams include your primary Cardiologist (physician) and Advanced Practice Providers (APPs -  Physician Assistants and Nurse Practitioners) who all work together to provide you with the care you need, when you need it.   Your next appointment:   12 month(s)  The format for your next appointment:   In Person  Provider:   Dorris Carnes, MD   Other Instructions

## 2020-10-09 NOTE — Progress Notes (Signed)
Since seen he has done well  Breating is good   Past Medical History:  Diagnosis Date   Carotid artery disease ([place])    Carotid US 11/17:Bilateral ICA 1-39   Coronary artery disease    a. LHC 11/17 with 3 vessel CAD, EF 55-65% // b. Status post CABG with Dr. Roxan Hockey 11/17 L-LAD, S-Dx, L radial-OM, S-AM/LPDA   Heart murmur    "I've had it my whole life" (10/08/2016)   High cholesterol    History of kidney stones    "passed it" (10/08/2016)   Prostate cancer ([place]) 2005    Past Surgical History:  Procedure Laterality Date   CARDIAC CATHETERIZATION  10/08/2016   CARDIAC CATHETERIZATION N/A 10/08/2016   Procedure: Left Heart Cath and Coronary Angiography;  Surgeon: Belva Crome, MD;  Location: [place] [facility];  Service: Cardiovascular;  Laterality: N/A;   COLONOSCOPY WITH PROPOFOL N/A 01/29/2016   Procedure: COLONOSCOPY WITH PROPOFOL;  Surgeon: Garlan Fair, MD;  [facility];  Service: Endoscopy;  Laterality: N/A;   CORONARY ARTERY BYPASS GRAFT N/A 10/10/2016   Procedure: CORONARY ARTERY BYPASS GRAFTING (CABG);  Surgeon: Melrose Nakayama, MD;  Location: [place];  Service: Open Heart Surgery;  Laterality: N/A;  Time 5  using left internal mammary artery, endoscopically harvested right saphenous vein, and left radial artery.   CYSTOSCOPY N/A 10/10/2016   Procedure: CYSTOSCOPY;  Surgeon: Melrose Nakayama, MD;  Location: [place];  Service: Open Heart Surgery;  Laterality: N/A;   INGUINAL HERNIA REPAIR Bilateral 1958   "before I was 75 months old ="   LEFT HEART CATH AND CORS/GRAFTS ANGIOGRAPHY N/A 04/15/2017   Procedure: Left Heart Cath and Cors/Grafts Angiography;  Surgeon: Belva Crome, MD;  Location: [place] [facility];  Service: Cardiovascular;  Laterality: N/A;   PROSTATE BIOPSY  2005   PROSTATECTOMY  2005   TEE WITHOUT CARDIOVERSION N/A 10/10/2016   Procedure: TRANSESOPHAGEAL ECHOCARDIOGRAM (TEE);  Surgeon: Melrose Nakayama, MD;  Location: [place];   Service: Open Heart Surgery;  Laterality: N/A;    Current Medications: Prior to Admission medications   Medication Sig Start Date End Date Taking? Authorizing Provider  aspirin EC 81 MG tablet Take 1 tablet (81 mg total) by mouth daily. 04/25/17   Fay Records, MD  ezetimibe (ZETIA) 10 MG tablet TAKE 1 TABLET BY MOUTH EVERY DAY 03/17/20   Fay Records, MD  rosuvastatin (CRESTOR) 40 MG tablet TAKE 1 TABLET BY MOUTH EVERY DAY 05/24/19   Fay Records, MD    Allergies:   Patient has no known allergies.   Social History   Socioeconomic History   Marital status: Married    Spouse name: Not on file   Number of children: Not on file   Years of education: Not on file   Highest education level: Not on file  Occupational History   Not on file  Tobacco Use   Smoking status: Former Smoker    Packs/day: 1.00    Years: 20.00    Pack years: 20.00    Types: Cigarettes    Quit date: 09/15/1998    Years since quitting: 22.0   Smokeless tobacco: Never Used  Vaping Use   Vaping Use: Never used  Substance and Sexual Activity   Alcohol use: Yes    Comment: "quit drinking 09/15/1998"   Drug use: Yes    Comment: 10/08/2016 "experimented in college; none since"   Sexual activity: Yes  Other Topics Concern  Not on file  Social History Narrative   Not on file   Social Determinants of Health   Financial Resource Strain:    Difficulty of Paying Living Expenses: Not on file  Food Insecurity:    Worried About [place] in the Last Year: Not on file   Ran Out of Food in the Last Year: Not on file  Transportation Needs:    Lack of Transportation (Medical): Not on file   Lack of Transportation (Non-Medical): Not on file  Physical Activity:    Days of Exercise per Week: Not on file   Minutes of Exercise per Session: Not on file  Stress:    Feeling of Stress : Not on file  Social Connections:    Frequency of Communication with Friends and Family: Not on file   Frequency of Social  Gatherings with Friends and Family: Not on file   Attends Religious Services: Not on file   Active Member of Clubs or Organizations: Not on file   Attends Archivist Meetings: Not on file   Marital Status: Not on file     Family History:  The patient's family history includes CAD in his mother; Obesity in his brother.   ROS:   Please see the history of present illness.    ROS All other systems reviewed and are negative.   PHYSICAL EXAM:   VS:  There were no vitals taken for this visit.   GEN: Well nourished, well developed, in no acute distress  HEENT: normal  Neck: no JVD, carotid bruits, or masses Cardiac: RRR; no murmurs, rubs, or gallops,no edema  Respiratory:  clear to auscultation bilaterally, normal work of breathing GI: soft, nontender, nondistended, + BS MS: no deformity or atrophy  Skin: warm and dry, no rash Neuro:  Alert and Oriented x 3, Strength and sensation are intact Psych: euthymic mood, full affect  Wt Readings from Last 3 Encounters:  10/09/20 214 lb 6.4 oz (97.3 kg)  04/27/20 213 lb (96.6 kg)  04/21/19 212 lb (96.2 kg)      Studies/Labs Reviewed:   EKG:  EKG is ordered today.  The ekg ordered today demonstrates normal sinus rhythm  Recent Labs: 04/14/2020: ALT 18; BUN 17; Creatinine, Ser 1.07; Hemoglobin 15.6; Platelets 206; Potassium 5.5; Sodium 140   Lipid Panel    Component Value Date/Time   CHOL 120 04/14/2020 0745   TRIG 83 04/14/2020 0745   HDL 51 04/14/2020 0745   CHOLHDL 2.4 04/14/2020 0745   CHOLHDL 4.3 10/07/2016 0816   VLDL 21 10/07/2016 0816   LDLCALC 53 04/14/2020 0745    Additional studies/ records that were reviewed today include:   As above     ASSESSMENT & PLAN:    CAD s/p CABG -No anginal symptoms.  EKG normal.  Continue aspirin and statin.  2. HLD -LDL excellently controlled.  Continue Zetia and Crestor.   Medication Adjustments/Labs and Tests Ordered: Current medicines are reviewed at length with  the patient today.  Concerns regarding medicines are outlined above.  Medication changes, Labs and Tests ordered today are listed in the Patient Instructions below. There are no Patient Instructions on file for this visit.   Signed, Dorris Carnes, MD  10/09/2020 2:21 PM    [address], [place], [place]  [address] Phone: [phone]; Fax: [phone]

## 2020-10-09 NOTE — Progress Notes (Signed)
Cardiology Office Note   Date:  10/09/2020   ID:  Joseph Jimenez, DOB June 01, 1956, MRN [id number]  PCP:  Johna Roles, PA  Cardiologist:   Dorris Carnes, MD   Pt presents for f/u of CAD   Needs clearance for FAA    History of Present Illness: Joseph Jimenez is a 64 y.o. male with a hx of CAD   Cath in November 2017 (LAD 95% prox, 50% mid; D1 0stial 90%; D2 ostial 80%; LCx 85% mid; OM1 905 ; RCA 90% prox; 80% mid; LVEF normal) He is s/p CABG (L-LAD, S-Dx, L radial-OM, S-AM/LPDA) 09/2016. He also has mild CV dz  He was last seen in cardiology clinic in June 2020    He was seen by B Bhagat in [place]   On June 1 he underweent exercise stress testing    Excellent exercise capacity   EKG negative for ischemia   The pt denied CP  BP at peak 221/81  He says he was wearing a mask and very uncomfortabe  Since then he has done well  No CP  No dizziness  No SOB  No palpitaitons        Current Meds  Medication Sig  . aspirin EC 81 MG tablet Take 1 tablet (81 mg total) by mouth daily.  Marland Kitchen ezetimibe (ZETIA) 10 MG tablet Take 1 tablet (10 mg total) by mouth daily.  . Multiple Vitamin (MULTIVITAMIN) tablet Take 1 tablet by mouth daily.  . rosuvastatin (CRESTOR) 40 MG tablet TAKE 1 TABLET BY MOUTH EVERY DAY     Allergies:   Patient has no known allergies.   Past Medical History:  Diagnosis Date  . Carotid artery disease ([place])    Carotid US 11/17:Bilateral ICA 1-39  . Coronary artery disease    a. LHC 11/17 with 3 vessel CAD, EF 55-65% // b. Status post CABG with Dr. Roxan Hockey 11/17 L-LAD, S-Dx, L radial-OM, S-AM/LPDA  . Heart murmur    "I've had it my whole life" (10/08/2016)  . High cholesterol   . History of kidney stones    "passed it" (10/08/2016)  . Prostate cancer ([place]) 2005    Past Surgical History:  Procedure Laterality Date  . CARDIAC CATHETERIZATION  10/08/2016  . CARDIAC CATHETERIZATION N/A 10/08/2016   Procedure: Left Heart Cath and Coronary  Angiography;  Surgeon: Belva Crome, MD;  Location: [facility];  Service: Cardiovascular;  Laterality: N/A;  . COLONOSCOPY WITH PROPOFOL N/A 01/29/2016   Procedure: COLONOSCOPY WITH PROPOFOL;  Surgeon: Garlan Fair, MD;  [facility];  Service: Endoscopy;  Laterality: N/A;  . CORONARY ARTERY BYPASS GRAFT N/A 10/10/2016   Procedure: CORONARY ARTERY BYPASS GRAFTING (CABG);  Surgeon: Melrose Nakayama, MD;  Location: [facility];  Service: Open Heart Surgery;  Laterality: N/A;  Time 5  using left internal mammary artery, endoscopically harvested right saphenous vein, and left radial artery.  Consuela Mimes N/A 10/10/2016   Procedure: CYSTOSCOPY;  Surgeon: Melrose Nakayama, MD;  Location: [facility];  Service: Open Heart Surgery;  Laterality: N/A;  . INGUINAL HERNIA REPAIR Bilateral 1958   "before I was 18 months old ="  . LEFT HEART CATH AND CORS/GRAFTS ANGIOGRAPHY N/A 04/15/2017   Procedure: Left Heart Cath and Cors/Grafts Angiography;  Surgeon: Belva Crome, MD;  Location: [facility];  Service: Cardiovascular;  Laterality: N/A;  . PROSTATE BIOPSY  2005  . PROSTATECTOMY  2005  . TEE WITHOUT CARDIOVERSION N/A 10/10/2016  Procedure: TRANSESOPHAGEAL ECHOCARDIOGRAM (TEE);  Surgeon: Melrose Nakayama, MD;  Location: [facility];  Service: Open Heart Surgery;  Laterality: N/A;     Social History:  The patient  reports that he quit smoking about 22 years ago. His smoking use included cigarettes. He has a 20.00 pack-year smoking history. He has never used smokeless tobacco. He reports current alcohol use. He reports current drug use.   Family History:  The patient's family history includes CAD in his mother; Obesity in his brother.    ROS:  Please see the history of present illness. All other systems are reviewed and  Negative to the above problem except as noted.    PHYSICAL EXAM: VS:  BP 140/78   Pulse 93   Ht 5\' 11"  (1.803 m)   Wt 214 lb 6.4 oz (97.3 kg)   SpO2 97%   BMI  29.90 kg/m   GEN: Well nourished, well developed, in no acute distress  HEENT: normal  Neck: no JVD, carotid bruits Cardiac: RRR; no murmurs,  No LE  edema  Respiratory:  clear to auscultation bilaterally, GI: soft, nontender, nondistended, + BS  No hepatomegaly  MS: no deformity Moving all extremities   Skin: warm and dry, no rash Neuro:  Strength and sensation are intact Psych: euthymic mood, full affect   EKG:  EKG is not  ordered today.   Lipid Panel    Component Value Date/Time   CHOL 120 04/14/2020 0745   TRIG 83 04/14/2020 0745   HDL 51 04/14/2020 0745   CHOLHDL 2.4 04/14/2020 0745   CHOLHDL 4.3 10/07/2016 0816   VLDL 21 10/07/2016 0816   LDLCALC 53 04/14/2020 0745      Wt Readings from Last 3 Encounters:  10/09/20 214 lb 6.4 oz (97.3 kg)  04/27/20 213 lb (96.6 kg)  04/21/19 212 lb (96.2 kg)      ASSESSMENT AND PLAN:  1 CAD.  Patient is doing very well.  He denies angina.  He remains very active.  Would continue to follow  2 dyslipidemia lipids were excellent on last check HDL was 51, LDL was 53.  Continue  3 FAA.  I filled out the paperwork for the [facility].  Overall I think he is a good candidate to continue.  I believe his blood pressure may have been elevated just because of timing and wearing the mask.  I would follow. We will set to see the patient back next summer.  Sooner for problems.  Current medicines are reviewed at length with the patient today.  The patient does not have concerns regarding medicines.  Signed, Dorris Carnes, MD  10/09/2020 2:07 PM    [address], [place], [place]  [address] Phone: [phone]; Fax: [phone]

## 2020-11-22 DIAGNOSIS — Z03818 Encounter for observation for suspected exposure to other biological agents ruled out: Secondary | ICD-10-CM | POA: Diagnosis not present

## 2020-11-28 DIAGNOSIS — Z03818 Encounter for observation for suspected exposure to other biological agents ruled out: Secondary | ICD-10-CM | POA: Diagnosis not present

## 2021-04-09 NOTE — Telephone Encounter (Signed)
Called pt.  Scheduled ov w Dr. Harrington Challenger 04/13/21.

## 2021-04-12 NOTE — Progress Notes (Signed)
Cardiology Office Note   Date:  04/14/2021   ID:  Joseph Jimenez, DOB 1956/01/10, MRN [id number]  PCP:  Joseph Roles, PA  Cardiologist:   Joseph Carnes, MD   Pt presents for f/u of CAD     History of Present Illness: Joseph Jimenez is a 65 y.o. male with a hx of CAD   Cath in November 2017 (LAD 95% prox, 50% mid; D1 0stial 90%; D2 ostial 80%; LCx 85% mid; OM1 905 ; RCA 90% prox; 80% mid; LVEF normal) He is s/p CABG (L-LAD, S-Dx, L radial-OM, S-AM/LPDA) 09/2016. He also has mild CV dz   I  sawa the pt in Nov 2021  Since seen he has done well  He is very active   Plays golf several times per [employer] with bag  No CP  No SOB     Diet   Skips breakfast   Lunch:  Immunologist and veggies   Half/hafl sweet tea at dinner   Otherwise water     Current Meds  Medication Sig  . aspirin EC 81 MG tablet Take 1 tablet (81 mg total) by mouth daily.  Marland Kitchen ezetimibe (ZETIA) 10 MG tablet Take 1 tablet (10 mg total) by mouth daily.  . Multiple Vitamin (MULTIVITAMIN) tablet Take 1 tablet by mouth daily.  . rosuvastatin (CRESTOR) 40 MG tablet TAKE 1 TABLET BY MOUTH EVERY DAY     Allergies:   Patient has no known allergies.   Past Medical History:  Diagnosis Date  . Carotid artery disease ([place])    Carotid US 11/17:Bilateral ICA 1-39  . Coronary artery disease    a. LHC 11/17 with 3 vessel CAD, EF 55-65% // b. Status post CABG with Dr. Roxan Hockey 11/17 L-LAD, S-Dx, L radial-OM, S-AM/LPDA  . Heart murmur    "I've had it my whole life" (10/08/2016)  . High cholesterol   . History of kidney stones    "passed it" (10/08/2016)  . Prostate cancer ([place]) 2005    Past Surgical History:  Procedure Laterality Date  . CARDIAC CATHETERIZATION  10/08/2016  . CARDIAC CATHETERIZATION N/A 10/08/2016   Procedure: Left Heart Cath and Coronary Angiography;  Surgeon: Joseph Crome, MD;  Location: [facility];  Service: Cardiovascular;  Laterality: N/A;  . COLONOSCOPY  WITH PROPOFOL N/A 01/29/2016   Procedure: COLONOSCOPY WITH PROPOFOL;  Surgeon: Joseph Fair, MD;  [facility];  Service: Endoscopy;  Laterality: N/A;  . CORONARY ARTERY BYPASS GRAFT N/A 10/10/2016   Procedure: CORONARY ARTERY BYPASS GRAFTING (CABG);  Surgeon: Joseph Nakayama, MD;  Location: [facility];  Service: Open Heart Surgery;  Laterality: N/A;  Time 5  using left internal mammary artery, endoscopically harvested right saphenous vein, and left radial artery.  Joseph Jimenez N/A 10/10/2016   Procedure: CYSTOSCOPY;  Surgeon: Joseph Nakayama, MD;  Location: [facility];  Service: Open Heart Surgery;  Laterality: N/A;  . INGUINAL HERNIA REPAIR Bilateral 1958   "before I was 57 months old ="  . LEFT HEART CATH AND CORS/GRAFTS ANGIOGRAPHY N/A 04/15/2017   Procedure: Left Heart Cath and Cors/Grafts Angiography;  Surgeon: Joseph Crome, MD;  Location: [facility];  Service: Cardiovascular;  Laterality: N/A;  . PROSTATE BIOPSY  2005  . PROSTATECTOMY  2005  . TEE WITHOUT CARDIOVERSION N/A 10/10/2016   Procedure: TRANSESOPHAGEAL ECHOCARDIOGRAM (TEE);  Surgeon: Joseph Nakayama, MD;  Location: [facility];  Service: Open Heart Surgery;  Laterality: N/A;     Social History:  The patient  reports that he quit smoking about 22 years ago. His smoking use included cigarettes. He has a 20.00 pack-year smoking history. He has never used smokeless tobacco. He reports current alcohol use. He reports current drug use.   Family History:  The patient's family history includes CAD in his mother; Obesity in his brother.    ROS:  Please see the history of present illness. All other systems are reviewed and  Negative to the above problem except as noted.    PHYSICAL EXAM: VS:  BP (!) 142/72   Pulse 70   Ht 5\' 11"  (1.803 m)   Wt 211 lb 6.4 oz (95.9 kg)   BMI 29.48 kg/m   GEN: Well nourished, well developed, in no acute distress  HEENT: normal  Neck: no JVD, carotid bruits Cardiac: RRR; no  murmurs,  No LE  edema  Respiratory:  clear to auscultation bilaterally, GI: soft, nontender, nondistended, + BS   MS: no deformity Moving all extremities   Skin: warm and dry, no rash Neuro:  Strength and sensation are intact Psych: euthymic mood, full affect   EKG:  EKG shows NSR 70 bpm     Lipid Panel    Component Value Date/Time   CHOL 125 04/13/2021 1112   TRIG 77 04/13/2021 1112   HDL 54 04/13/2021 1112   CHOLHDL 2.3 04/13/2021 1112   CHOLHDL 4.3 10/07/2016 0816   VLDL 21 10/07/2016 0816   LDLCALC 56 04/13/2021 1112      Wt Readings from Last 3 Encounters:  04/13/21 211 lb 6.4 oz (95.9 kg)  10/09/20 214 lb 6.4 oz (97.3 kg)  04/27/20 213 lb (96.6 kg)      ASSESSMENT AND PLAN:  1 CAD.Pt continues to do well   No symptoms of angina   Remains very active Will set up for stress myoview as required by FAA  2  Lipids   Remains on Crestor and Zetia   WIll get lipids today     Labs:  CBC, BMET, TSH, lpids, Hgb A1C and PSA  F/U next winter     Current medicines are reviewed at length with the patient today.  The patient does not have concerns regarding medicines.  Signed, Joseph Carnes, MD  04/14/2021 9:16 AM    [facility] [place], [place], [place]  [address] Phone: [phone]; Fax: [phone]

## 2021-04-13 ENCOUNTER — Ambulatory Visit: Payer: BC Managed Care – PPO | Admitting: Internal Medicine

## 2021-04-13 ENCOUNTER — Other Ambulatory Visit: Payer: Self-pay

## 2021-04-13 ENCOUNTER — Encounter: Payer: Self-pay | Admitting: Internal Medicine

## 2021-04-13 VITALS — BP 142/72 | HR 70 | Ht 71.0 in | Wt 211.4 lb

## 2021-04-13 DIAGNOSIS — I2510 Atherosclerotic heart disease of native coronary artery without angina pectoris: Secondary | ICD-10-CM

## 2021-04-13 NOTE — Patient Instructions (Signed)
Medication Instructions:  No changes *If you need a refill on your cardiac medications before your next appointment, please call your pharmacy*   Lab Work: Today: cbc, bmet, tsh, liver function, lipid panel, psa, hgA1c  If you have labs (blood work) drawn today and your tests are completely normal, you will receive your results only by: Marland Kitchen MyChart Message (if you have MyChart) OR . A paper copy in the mail If you have any lab test that is abnormal or we need to change your treatment, we will call you to review the results.   Testing/Procedures:  Placed hold on 5/26 2:30 pm Your physician has requested that you have an exercise tolerance test. For further information please visit [email or web address]. Please also follow instruction sheet, as given.   Follow-Up: At [facility], you and your health needs are our priority.  As part of our continuing mission to provide you with exceptional heart care, we have created designated Provider Care Teams.  These Care Teams include your primary Cardiologist (physician) and Advanced Practice Providers (APPs -  Physician Assistants and Nurse Practitioners) who all work together to provide you with the care you need, when you need it.  Your next appointment:   12 month(s)  The format for your next appointment:   In Person  Provider:   You may see Dorris Carnes, MD or one of the following Advanced Practice Providers on your designated Care Team:    Richardson Dopp, PA-C  Robbie Lis, [place]  Other Instructions

## 2021-04-14 LAB — BASIC METABOLIC PANEL
BUN/Creatinine Ratio: 18 (ref 10–24)
BUN: 17 mg/dL (ref 8–27)
CO2: 22 mmol/L (ref 20–29)
Calcium: 9.5 mg/dL (ref 8.6–10.2)
Chloride: 102 mmol/L (ref 96–106)
Creatinine, Ser: 0.97 mg/dL (ref 0.76–1.27)
Glucose: 98 mg/dL (ref 65–99)
Potassium: 5.3 mmol/L — ABNORMAL HIGH (ref 3.5–5.2)
Sodium: 141 mmol/L (ref 134–144)
eGFR: 87 mL/min/{1.73_m2} (ref >59)

## 2021-04-14 LAB — HEPATIC FUNCTION PANEL
ALT: 23 IU/L (ref 0–44)
AST: 35 IU/L (ref 0–40)
Albumin: 4.8 g/dL (ref 3.8–4.8)
Alkaline Phosphatase: 63 IU/L (ref 44–121)
Bilirubin Total: 0.8 mg/dL (ref 0.0–1.2)
Bilirubin, Direct: 0.26 mg/dL (ref 0.00–0.40)
Total Protein: 7.3 g/dL (ref 6.0–8.5)

## 2021-04-14 LAB — HEMOGLOBIN A1C
Est. average glucose Bld gHb Est-mCnc: 120 mg/dL
Hgb A1c MFr Bld: 5.8 % — ABNORMAL HIGH (ref 4.8–5.6)

## 2021-04-14 LAB — CBC
Hematocrit: 47.8 % (ref 37.5–51.0)
Hemoglobin: 16.3 g/dL (ref 13.0–17.7)
MCH: 30.5 pg (ref 26.6–33.0)
MCHC: 34.1 g/dL (ref 31.5–35.7)
MCV: 89 fL (ref 79–97)
Platelets: 199 10*3/uL (ref 150–450)
RBC: 5.35 x10E6/uL (ref 4.14–5.80)
RDW: 13 % (ref 11.6–15.4)
WBC: 7.9 10*3/uL (ref 3.4–10.8)

## 2021-04-14 LAB — LIPID PANEL
Chol/HDL Ratio: 2.3 ratio (ref 0.0–5.0)
Cholesterol, Total: 125 mg/dL (ref 100–199)
HDL: 54 mg/dL (ref >39)
LDL Chol Calc (NIH): 56 mg/dL (ref 0–99)
Triglycerides: 77 mg/dL (ref 0–149)
VLDL Cholesterol Cal: 15 mg/dL (ref 5–40)

## 2021-04-14 LAB — TSH: TSH: 3.93 u[IU]/mL (ref 0.450–4.500)

## 2021-04-14 LAB — PSA: Prostate Specific Ag, Serum: <0.1 ng/mL (ref 0.0–4.0)

## 2021-04-17 NOTE — Addendum Note (Signed)
Addended by: Rodman Key on: 04/17/2021 06:07 PM   Modules accepted: Orders

## 2021-04-18 NOTE — Addendum Note (Signed)
Addended by: Fay Records on: 04/18/2021 04:25 PM   Modules accepted: Orders

## 2021-04-19 ENCOUNTER — Ambulatory Visit (INDEPENDENT_AMBULATORY_CARE_PROVIDER_SITE_OTHER): Payer: BC Managed Care – PPO

## 2021-04-19 ENCOUNTER — Other Ambulatory Visit: Payer: Self-pay

## 2021-04-19 DIAGNOSIS — I2510 Atherosclerotic heart disease of native coronary artery without angina pectoris: Secondary | ICD-10-CM

## 2021-04-19 LAB — EXERCISE TOLERANCE TEST
Estimated workload: 11.5 METS
Exercise duration (min): 9 min
Exercise duration (sec): 53 s
MPHR: 156 {beats}/min
Peak HR: 160 {beats}/min
Percent HR: 102 %
Rest HR: 65 {beats}/min

## 2021-04-25 ENCOUNTER — Telehealth: Payer: Self-pay | Admitting: Internal Medicine

## 2021-04-25 NOTE — Telephone Encounter (Signed)
Pt asking about his FAA information. I have his labs, office visit note and ETT report. Requests letter from Dr. Harrington Challenger summarizing as has been done in past years.  Once letter is ready I will let him know the packet is ready and place at front desk where he will pick up.

## 2021-04-25 NOTE — Telephone Encounter (Signed)
    Pt calling to get ETT result, he also ask if its been sent to [facility]?

## 2021-04-30 ENCOUNTER — Encounter: Payer: Self-pay | Admitting: Internal Medicine

## 2021-06-11 ENCOUNTER — Other Ambulatory Visit: Payer: Self-pay | Admitting: Internal Medicine

## 2021-06-11 DIAGNOSIS — I2510 Atherosclerotic heart disease of native coronary artery without angina pectoris: Secondary | ICD-10-CM

## 2021-06-11 DIAGNOSIS — E785 Hyperlipidemia, unspecified: Secondary | ICD-10-CM

## 2021-07-09 ENCOUNTER — Encounter: Payer: Self-pay | Admitting: *Deleted

## 2021-07-09 ENCOUNTER — Telehealth: Payer: Self-pay | Admitting: Internal Medicine

## 2021-07-09 DIAGNOSIS — I2510 Atherosclerotic heart disease of native coronary artery without angina pectoris: Secondary | ICD-10-CM

## 2021-07-09 NOTE — Addendum Note (Signed)
Addended by: Rodman Key on: 07/09/2021 01:21 PM   Modules accepted: Orders

## 2021-07-09 NOTE — Telephone Encounter (Signed)
Patient got  [facility] letter   Needs exercise Myoview for clearance   Can do Tues, Wed, Thursday next week   Would like at [address]

## 2021-07-16 NOTE — Addendum Note (Signed)
Addended by: Fay Records on: 07/16/2021 06:51 AM   Modules accepted: Orders

## 2021-07-19 ENCOUNTER — Telehealth (HOSPITAL_COMMUNITY): Payer: Self-pay | Admitting: *Deleted

## 2021-07-19 NOTE — Telephone Encounter (Signed)
Patient given detailed instructions per Myocardial Perfusion Study Information Sheet for the test on 07/25/21 Patient notified to arrive 15 minutes early and that it is imperative to arrive on time for appointment to keep from having the test rescheduled.  If you need to cancel or reschedule your appointment, please call the office within 24 hours of your appointment. . Patient verbalized understanding. Joseph Jimenez Joseph Jimenez   

## 2021-07-25 ENCOUNTER — Other Ambulatory Visit: Payer: Self-pay

## 2021-07-25 ENCOUNTER — Ambulatory Visit (HOSPITAL_COMMUNITY): Payer: BC Managed Care – PPO | Attending: Cardiology

## 2021-07-25 DIAGNOSIS — I2510 Atherosclerotic heart disease of native coronary artery without angina pectoris: Secondary | ICD-10-CM | POA: Diagnosis not present

## 2021-07-25 LAB — MYOCARDIAL PERFUSION IMAGING
Angina Index: 0
Duke Treadmill Score: 10
Estimated workload: 11.7
Exercise duration (min): 10 min
LV dias vol: 70 mL (ref 62–150)
LV sys vol: 29 mL
MPHR: 156 {beats}/min
Nuc Stress EF: 59 %
Peak HR: 160 {beats}/min
Percent HR: 102 %
RPE: 18
Rest HR: 66 {beats}/min
Rest Nuclear Isotope Dose: 10.9 mCi
SDS: 0
SRS: 0
SSS: 0
ST Depression (mm): 0 mm
Stress Nuclear Isotope Dose: 31.1 mCi
TID: 0.81

## 2021-07-25 MED ORDER — TECHNETIUM TC 99M TETROFOSMIN IV KIT
30.0000 | PACK | Freq: Once | INTRAVENOUS | Status: AC | PRN
  Administered 2021-07-25: 31.1 via INTRAVENOUS
  Filled 2021-07-25: qty 30

## 2021-07-25 MED ORDER — TECHNETIUM TC 99M TETROFOSMIN IV KIT
10.9000 | PACK | Freq: Once | INTRAVENOUS | Status: AC | PRN
  Administered 2021-07-25: 10.9 via INTRAVENOUS
  Filled 2021-07-25: qty 11

## 2021-07-25 MED ORDER — REGADENOSON 0.4 MG/5ML IV SOLN: 0.4000 mg | Freq: Once | INTRAVENOUS | Status: AC

## 2021-07-26 ENCOUNTER — Telehealth: Payer: Self-pay | Admitting: Internal Medicine

## 2021-07-26 NOTE — Telephone Encounter (Signed)
Patient has that the nurse go ahead a leave a copy of nuclear test at the front desk. Please advise

## 2021-07-27 NOTE — Telephone Encounter (Signed)
Documents were given to patient yesterday per Dr. Harrington Challenger.

## 2022-06-09 ENCOUNTER — Other Ambulatory Visit: Payer: Self-pay | Admitting: Internal Medicine

## 2022-06-09 DIAGNOSIS — E785 Hyperlipidemia, unspecified: Secondary | ICD-10-CM

## 2022-06-09 DIAGNOSIS — I2510 Atherosclerotic heart disease of native coronary artery without angina pectoris: Secondary | ICD-10-CM

## 2022-07-04 ENCOUNTER — Other Ambulatory Visit: Payer: Self-pay | Admitting: Internal Medicine

## 2022-07-04 DIAGNOSIS — I2510 Atherosclerotic heart disease of native coronary artery without angina pectoris: Secondary | ICD-10-CM

## 2022-07-04 DIAGNOSIS — E785 Hyperlipidemia, unspecified: Secondary | ICD-10-CM

## 2022-07-08 ENCOUNTER — Telehealth: Payer: Self-pay | Admitting: Internal Medicine

## 2022-07-08 DIAGNOSIS — E785 Hyperlipidemia, unspecified: Secondary | ICD-10-CM

## 2022-07-08 DIAGNOSIS — I2510 Atherosclerotic heart disease of native coronary artery without angina pectoris: Secondary | ICD-10-CM

## 2022-07-08 MED ORDER — EZETIMIBE 10 MG PO TABS: 10.0000 mg | ORAL_TABLET | Freq: Every day | ORAL | 3 refills | Status: DC

## 2022-07-08 MED ORDER — ROSUVASTATIN CALCIUM 40 MG PO TABS: 40.0000 mg | ORAL_TABLET | Freq: Every day | ORAL | 3 refills | Status: DC

## 2022-07-08 NOTE — Telephone Encounter (Signed)
Refilled rosuvastatin and ezetimibe as requested by Dr. Harrington Challenger. Drema Halon PCP for last ov note to be faxed to our office.  Spoke w [place] in medical records there. Faxing to [phone] - ov and labs from 06/19/22.  Message to patient via [place].

## 2022-07-08 NOTE — Telephone Encounter (Signed)
Patient called Please approve 12 months then I will see him back Please get records from [facility]   He was just seen

## 2022-07-08 NOTE — Addendum Note (Signed)
Addended by: Rodman Key on: 07/08/2022 11:34 AM   Modules accepted: Orders

## 2022-12-16 DIAGNOSIS — Z0000 Encounter for general adult medical examination without abnormal findings: Secondary | ICD-10-CM | POA: Diagnosis not present

## 2023-03-06 DIAGNOSIS — Z86010 Personal history of colonic polyps: Secondary | ICD-10-CM | POA: Diagnosis not present

## 2023-03-06 DIAGNOSIS — Z800 Family history of malignant neoplasm of digestive organs: Secondary | ICD-10-CM | POA: Diagnosis not present

## 2023-03-06 DIAGNOSIS — K5730 Diverticulosis of large intestine without perforation or abscess without bleeding: Secondary | ICD-10-CM | POA: Diagnosis not present

## 2023-03-06 DIAGNOSIS — Z09 Encounter for follow-up examination after completed treatment for conditions other than malignant neoplasm: Secondary | ICD-10-CM | POA: Diagnosis not present

## 2023-03-06 DIAGNOSIS — D124 Benign neoplasm of descending colon: Secondary | ICD-10-CM | POA: Diagnosis not present

## 2023-03-06 DIAGNOSIS — K6289 Other specified diseases of anus and rectum: Secondary | ICD-10-CM | POA: Diagnosis not present

## 2023-03-06 DIAGNOSIS — K635 Polyp of colon: Secondary | ICD-10-CM | POA: Diagnosis not present

## 2023-03-10 DIAGNOSIS — D124 Benign neoplasm of descending colon: Secondary | ICD-10-CM | POA: Diagnosis not present

## 2023-03-10 DIAGNOSIS — K635 Polyp of colon: Secondary | ICD-10-CM | POA: Diagnosis not present

## 2023-07-20 ENCOUNTER — Other Ambulatory Visit: Payer: Self-pay | Admitting: Internal Medicine

## 2023-07-20 DIAGNOSIS — E785 Hyperlipidemia, unspecified: Secondary | ICD-10-CM

## 2023-07-20 DIAGNOSIS — I2510 Atherosclerotic heart disease of native coronary artery without angina pectoris: Secondary | ICD-10-CM

## 2023-08-17 ENCOUNTER — Other Ambulatory Visit: Payer: Self-pay | Admitting: Internal Medicine

## 2023-08-17 DIAGNOSIS — E785 Hyperlipidemia, unspecified: Secondary | ICD-10-CM

## 2023-08-17 DIAGNOSIS — I2510 Atherosclerotic heart disease of native coronary artery without angina pectoris: Secondary | ICD-10-CM

## 2023-08-28 ENCOUNTER — Other Ambulatory Visit: Payer: Self-pay | Admitting: Internal Medicine

## 2023-08-28 DIAGNOSIS — I2510 Atherosclerotic heart disease of native coronary artery without angina pectoris: Secondary | ICD-10-CM

## 2023-08-28 DIAGNOSIS — E785 Hyperlipidemia, unspecified: Secondary | ICD-10-CM

## 2023-10-01 ENCOUNTER — Telehealth: Payer: Self-pay

## 2023-10-01 DIAGNOSIS — E785 Hyperlipidemia, unspecified: Secondary | ICD-10-CM

## 2023-10-01 DIAGNOSIS — I779 Disorder of arteries and arterioles, unspecified: Secondary | ICD-10-CM

## 2023-10-01 DIAGNOSIS — I2510 Atherosclerotic heart disease of native coronary artery without angina pectoris: Secondary | ICD-10-CM

## 2023-10-01 DIAGNOSIS — Z131 Encounter for screening for diabetes mellitus: Secondary | ICD-10-CM

## 2023-10-01 DIAGNOSIS — Z8249 Family history of ischemic heart disease and other diseases of the circulatory system: Secondary | ICD-10-CM

## 2023-10-01 DIAGNOSIS — Z1329 Encounter for screening for other suspected endocrine disorder: Secondary | ICD-10-CM

## 2023-10-01 DIAGNOSIS — E7849 Other hyperlipidemia: Secondary | ICD-10-CM

## 2023-10-01 DIAGNOSIS — Z125 Encounter for screening for malignant neoplasm of prostate: Secondary | ICD-10-CM

## 2023-10-01 NOTE — Telephone Encounter (Signed)
Pt needs follow up with Dr Tenny Craw:   Needs labs prior per Dr Tenny Craw:   CBC, CMET, TSH, lipomed, A1C PSA if he has not had done   Will call the pt.

## 2023-10-02 ENCOUNTER — Telehealth: Payer: Self-pay | Admitting: Internal Medicine

## 2023-10-02 ENCOUNTER — Other Ambulatory Visit: Payer: Self-pay

## 2023-10-02 DIAGNOSIS — Z125 Encounter for screening for malignant neoplasm of prostate: Secondary | ICD-10-CM

## 2023-10-02 DIAGNOSIS — E785 Hyperlipidemia, unspecified: Secondary | ICD-10-CM

## 2023-10-02 DIAGNOSIS — E7849 Other hyperlipidemia: Secondary | ICD-10-CM

## 2023-10-02 DIAGNOSIS — I2510 Atherosclerotic heart disease of native coronary artery without angina pectoris: Secondary | ICD-10-CM

## 2023-10-02 DIAGNOSIS — I779 Disorder of arteries and arterioles, unspecified: Secondary | ICD-10-CM

## 2023-10-02 DIAGNOSIS — Z8249 Family history of ischemic heart disease and other diseases of the circulatory system: Secondary | ICD-10-CM

## 2023-10-02 DIAGNOSIS — Z131 Encounter for screening for diabetes mellitus: Secondary | ICD-10-CM

## 2023-10-02 DIAGNOSIS — Z1329 Encounter for screening for other suspected endocrine disorder: Secondary | ICD-10-CM

## 2023-10-02 NOTE — Telephone Encounter (Signed)
Left a message for the pt to call back... labs ordered.

## 2023-10-02 NOTE — Telephone Encounter (Signed)
Patient called and said that he had personally talked with the Dr. About making a sooner appt. Please call back

## 2023-10-03 DIAGNOSIS — E785 Hyperlipidemia, unspecified: Secondary | ICD-10-CM | POA: Diagnosis not present

## 2023-10-03 DIAGNOSIS — I2510 Atherosclerotic heart disease of native coronary artery without angina pectoris: Secondary | ICD-10-CM | POA: Diagnosis not present

## 2023-10-03 DIAGNOSIS — E7849 Other hyperlipidemia: Secondary | ICD-10-CM | POA: Diagnosis not present

## 2023-10-03 DIAGNOSIS — Z131 Encounter for screening for diabetes mellitus: Secondary | ICD-10-CM | POA: Diagnosis not present

## 2023-10-03 DIAGNOSIS — Z8249 Family history of ischemic heart disease and other diseases of the circulatory system: Secondary | ICD-10-CM | POA: Diagnosis not present

## 2023-10-03 NOTE — Telephone Encounter (Signed)
Pt to have labs today and I will work him in to see her in the next week or so.

## 2023-10-03 NOTE — Telephone Encounter (Signed)
I spoke with the pt and see other open encounter.

## 2023-10-03 NOTE — Telephone Encounter (Signed)
Pt to see Dr Tenny Craw 10/06/23.

## 2023-10-04 LAB — NMR, LIPOPROFILE
Cholesterol, Total: 117 mg/dL (ref 100–199)
HDL Particle Number: 35.5 umol/L (ref >=30.5)
HDL-C: 49 mg/dL (ref >39)
LDL Particle Number: 742 nmol/L (ref <1000)
LDL Size: 20.1 nm — ABNORMAL LOW (ref >20.5)
LDL-C (NIH Calc): 48 mg/dL (ref 0–99)
LP-IR Score: 41 (ref <=45)
Small LDL Particle Number: 402 nmol/L (ref <=527)
Triglycerides: 106 mg/dL (ref 0–149)

## 2023-10-04 LAB — HEMOGLOBIN A1C
Est. average glucose Bld gHb Est-mCnc: 123 mg/dL
Hgb A1c MFr Bld: 5.9 % — ABNORMAL HIGH (ref 4.8–5.6)

## 2023-10-04 LAB — COMPREHENSIVE METABOLIC PANEL
ALT: 20 [IU]/L (ref 0–44)
AST: 27 [IU]/L (ref 0–40)
Albumin: 4.6 g/dL (ref 3.9–4.9)
Alkaline Phosphatase: 69 [IU]/L (ref 44–121)
BUN/Creatinine Ratio: 20 (ref 10–24)
BUN: 18 mg/dL (ref 8–27)
Bilirubin Total: 0.5 mg/dL (ref 0.0–1.2)
CO2: 22 mmol/L (ref 20–29)
Calcium: 9.3 mg/dL (ref 8.6–10.2)
Chloride: 102 mmol/L (ref 96–106)
Creatinine, Ser: 0.88 mg/dL (ref 0.76–1.27)
Globulin, Total: 2.2 g/dL (ref 1.5–4.5)
Glucose: 139 mg/dL — ABNORMAL HIGH (ref 70–99)
Potassium: 4.2 mmol/L (ref 3.5–5.2)
Sodium: 139 mmol/L (ref 134–144)
Total Protein: 6.8 g/dL (ref 6.0–8.5)
eGFR: 95 mL/min/{1.73_m2} (ref >59)

## 2023-10-04 LAB — CBC
Hematocrit: 46.7 % (ref 37.5–51.0)
Hemoglobin: 15.3 g/dL (ref 13.0–17.7)
MCH: 29.3 pg (ref 26.6–33.0)
MCHC: 32.8 g/dL (ref 31.5–35.7)
MCV: 89 fL (ref 79–97)
Platelets: 210 10*3/uL (ref 150–450)
RBC: 5.23 x10E6/uL (ref 4.14–5.80)
RDW: 13.2 % (ref 11.6–15.4)
WBC: 7.5 10*3/uL (ref 3.4–10.8)

## 2023-10-04 LAB — TSH: TSH: 3.4 u[IU]/mL (ref 0.450–4.500)

## 2023-10-06 ENCOUNTER — Encounter: Payer: Self-pay | Admitting: Internal Medicine

## 2023-10-06 ENCOUNTER — Ambulatory Visit: Payer: Medicare HMO | Attending: Internal Medicine | Admitting: Internal Medicine

## 2023-10-06 ENCOUNTER — Telehealth: Payer: Self-pay | Admitting: *Deleted

## 2023-10-06 VITALS — BP 150/100 | HR 62 | Ht 70.5 in | Wt 214.6 lb

## 2023-10-06 DIAGNOSIS — I2510 Atherosclerotic heart disease of native coronary artery without angina pectoris: Secondary | ICD-10-CM | POA: Diagnosis not present

## 2023-10-06 DIAGNOSIS — Z8249 Family history of ischemic heart disease and other diseases of the circulatory system: Secondary | ICD-10-CM | POA: Diagnosis not present

## 2023-10-06 DIAGNOSIS — R0683 Snoring: Secondary | ICD-10-CM

## 2023-10-06 MED ORDER — AMLODIPINE BESYLATE 2.5 MG PO TABS
2.5000 mg | ORAL_TABLET | Freq: Every day | ORAL | 3 refills | Status: AC
Start: 2023-10-06 — End: ?

## 2023-10-06 NOTE — Telephone Encounter (Signed)
DR. Tenny Craw ORDERED ITAMAR STUDY.   Patient agreement reviewed and signed on 10/06/2023.  WatchPAT issued to patient on 10/06/2023 by Danielle Rankin, CMA. Patient aware to not open the WatchPAT box until contacted with the activation PIN. Patient profile initialized in CloudPAT on 10/06/2023 by Danielle Rankin, CMA. Device serial number: [id number]  Please list Reason for Call as Advice Only and type "WatchPAT issued to patient" in the comment box.

## 2023-10-06 NOTE — Patient Instructions (Addendum)
Medication Instructions:  START AMLODIPINE  2.5 MG DAILY  *If you need a refill on your cardiac medications before your next appointment, please call your pharmacy*   Lab Work:  If you have labs (blood work) drawn today and your tests are completely normal, you will receive your results only by: MyChart Message (if you have MyChart) OR A paper copy in the mail If you have any lab test that is abnormal or we need to change your treatment, we will call you to review the results.   Testing/Procedures: Joseph Jimenez SLEEP STUDY    Follow-Up: one year follow up At [facility], you and your health needs are our priority.  As part of our continuing mission to provide you with exceptional heart care, we have created designated Provider Care Teams.  These Care Teams include your primary Cardiologist (physician) and Advanced Practice Providers (APPs -  Physician Assistants and Nurse Practitioners) who all work together to provide you with the care you need, when you need it.  We recommend signing up for the patient portal called "MyChart".  Sign up information is provided on this After Visit Summary.  MyChart is used to connect with patients for Virtual Visits (Telemedicine).  Patients are able to view lab/test results, encounter notes, upcoming appointments, etc.  Non-urgent messages can be sent to your provider as well.   To learn more about what you can do with MyChart, go to [email or web address].

## 2023-10-06 NOTE — Progress Notes (Addendum)
Cardiology Office Note   Date:  10/06/2023   ID:  Joseph Jimenez, DOB 05/22/1956, MRN [id number]  PCP:  Delma Officer, PA  Cardiologist:   Dietrich Pates, MD   Pt presents for f/u of CAD     History of Present Illness: Joseph Jimenez is a 67 y.o. male with a hx of CAD   Cath in November 2017 (LAD 95% prox, 50% mid; D1 0stial 90%; D2 ostial 80%; LCx 85% mid; OM1 905 ; RCA 90% prox; 80% mid; LVEF normal)  He is  s/p CABG (L-LAD, S-Dx, L radial-OM, S-AM/LPDA) 09/2016.    He also has mild CV dz    Diet   Skips breakfast   Lunch:  Insurance underwriter and veggies   Half/hafl sweet tea at dinner   Otherwise water    I saw the pt in May 2022   Since seen he has done well  Denies CP  Breathing is good   No dizziness  No palpitations      BP at home will start in 150 range With sitting, BP will go down to 130s  He says he snores at night   Current Meds  Medication Sig   aspirin EC 81 MG tablet Take 1 tablet (81 mg total) by mouth daily.   ezetimibe (ZETIA) 10 MG tablet TAKE 1 TABLET BY MOUTH EVERY DAY   Multiple Vitamin (MULTIVITAMIN) tablet Take 1 tablet by mouth daily.   rosuvastatin (CRESTOR) 40 MG tablet TAKE 1 TABLET BY MOUTH EVERY DAY     Allergies:   Patient has no known allergies.   Past Medical History:  Diagnosis Date   Carotid artery disease ([facility])    Carotid US 11/17:Bilateral ICA 1-39   Coronary artery disease    a. LHC 11/17 with 3 vessel CAD, EF 55-65% // b. Status post CABG with Dr. Dorris Fetch 11/17 L-LAD, S-Dx, L radial-OM, S-AM/LPDA   Heart murmur    "I've had it my whole life" (10/08/2016)   High cholesterol    History of kidney stones    "passed it" (10/08/2016)   Prostate cancer ([facility]) 2005    Past Surgical History:  Procedure Laterality Date   CARDIAC CATHETERIZATION  10/08/2016   CARDIAC CATHETERIZATION N/A 10/08/2016   Procedure: Left Heart Cath and Coronary Angiography;  Surgeon: Lyn Records, MD;  [facility]: [facility];   Service: Cardiovascular;  Laterality: N/A;   COLONOSCOPY WITH PROPOFOL N/A 01/29/2016   Procedure: COLONOSCOPY WITH PROPOFOL;  Surgeon: Charolett Bumpers, MD;  [facility];  Service: Endoscopy;  Laterality: N/A;   CORONARY ARTERY BYPASS GRAFT N/A 10/10/2016   Procedure: CORONARY ARTERY BYPASS GRAFTING (CABG);  Surgeon: Loreli Slot, MD;  Location: [facility];  Service: Open Heart Surgery;  Laterality: N/A;  Time 5  using left internal mammary artery, endoscopically harvested right saphenous vein, and left radial artery.   CYSTOSCOPY N/A 10/10/2016   Procedure: CYSTOSCOPY;  Surgeon: Loreli Slot, MD;  [facility];  Service: Open Heart Surgery;  Laterality: N/A;   INGUINAL HERNIA REPAIR Bilateral 1958   "before I was 77 months old ="   LEFT HEART CATH AND CORS/GRAFTS ANGIOGRAPHY N/A 04/15/2017   Procedure: Left Heart Cath and Cors/Grafts Angiography;  Surgeon: Lyn Records, MD;  Location: [facility] INVASIVE CV LAB;  Service: Cardiovascular;  Laterality: N/A;   PROSTATE BIOPSY  2005   PROSTATECTOMY  2005   TEE WITHOUT CARDIOVERSION N/A 10/10/2016  Procedure: TRANSESOPHAGEAL ECHOCARDIOGRAM (TEE);  Surgeon: Loreli Slot, MD;  Location: [facility] OR;  Service: Open Heart Surgery;  Laterality: N/A;     Social History:   Quit tobacco years ago    Does not drink EtOH     Family History:  The patient's family history includes CAD in his mother; Obesity in his brother.    ROS:  Please see the history of present illness. All other systems are reviewed and  Negative to the above problem except as noted.    PHYSICAL EXAM: VS:  BP (!) 150/100   Pulse 62   Ht 5' 10.5" (1.791 m)   Wt 214 lb 9.6 oz (97.3 kg)   SpO2 98%   BMI 30.36 kg/m   GEN: Overweight 67 yo in no acute distress  HEENT: normal  Neck: no JVD, No bruit  Cardiac: RRR; no murmur,  No LE  edema  Respiratory:  clear to auscultation  GI: soft, nontender, nondistended, No masses     EKG:  EKG shows NSR 62 bpm   LVH   Possible IWMI      Lipid Panel    Component Value Date/Time   CHOL 125 04/13/2021 1112   TRIG 77 04/13/2021 1112   HDL 54 04/13/2021 1112   CHOLHDL 2.3 04/13/2021 1112   CHOLHDL 4.3 10/07/2016 0816   VLDL 21 10/07/2016 0816   LDLCALC 56 04/13/2021 1112      Wt Readings from Last 3 Encounters:  10/06/23 214 lb 9.6 oz (97.3 kg)  07/25/21 211 lb (95.7 kg)  04/13/21 211 lb 6.4 oz (95.9 kg)      ASSESSMENT AND PLAN:  1 CAD.Pt is s/p CABG about 7 years ago   Continues to do well   Active   No angina  Follow  2  Lipids   LDL 48 with 742 particles   HDL 49  Trig 106   keep on same meds   3  HTN  BP on my check 150/86   Still too high   I would start slow with amlodipine 2.5 mg    Follow BP at home     4  Snoring   Will set up for home sleep study  Pt says he has long hx of snoring     Important to define given BP        Current medicines are reviewed at length with the patient today.  The patient does not have concerns regarding medicines.  Signed, Dietrich Pates, MD  10/06/2023 10:19 AM    [facility] [address] [place], [place], [place]  [address] Phone: [phone]; Fax: [phone]

## 2023-10-10 ENCOUNTER — Encounter: Payer: Self-pay | Admitting: Internal Medicine

## 2023-11-29 ENCOUNTER — Other Ambulatory Visit: Payer: Self-pay | Admitting: Internal Medicine

## 2023-11-29 DIAGNOSIS — E785 Hyperlipidemia, unspecified: Secondary | ICD-10-CM

## 2023-11-29 DIAGNOSIS — I2510 Atherosclerotic heart disease of native coronary artery without angina pectoris: Secondary | ICD-10-CM

## 2024-01-26 ENCOUNTER — Encounter: Payer: Self-pay | Admitting: Internal Medicine

## 2024-01-27 ENCOUNTER — Telehealth: Payer: Self-pay

## 2024-01-27 ENCOUNTER — Encounter (INDEPENDENT_AMBULATORY_CARE_PROVIDER_SITE_OTHER): Payer: Self-pay | Admitting: Cardiology

## 2024-01-27 DIAGNOSIS — G4733 Obstructive sleep apnea (adult) (pediatric): Secondary | ICD-10-CM

## 2024-01-27 NOTE — Telephone Encounter (Signed)
 Ordering provider: Lovina Reach Associated diagnoses: I25.10, I77.9 WatchPAT PA obtained on 01/27/2024 by Brunetta Genera, LPN. Authorization: Yes; tracking ID  [id number] Patient notified of PIN (1234) on 01/27/2024 via Notification Method: MyChart message.  Phone note routed to covering staff for follow-up.  Instructions for covering staff:  Please contact patient in 2 weeks if WatchPAT study results are not available yet. Remind patient to complete test.  If patient declines to proceed with test, please confirm that box is unopened and remind patient to return it to the office within 30 days. Route phone note to CV DIV SLEEP STUDIES pool for tracking.  If box has been opened, please route phone note to CV DIV SLEEP STUDIES pool to have device de-initialized and processed for billing.

## 2024-01-28 ENCOUNTER — Telehealth: Payer: Self-pay

## 2024-01-28 ENCOUNTER — Ambulatory Visit: Attending: Internal Medicine

## 2024-01-28 DIAGNOSIS — Z8249 Family history of ischemic heart disease and other diseases of the circulatory system: Secondary | ICD-10-CM

## 2024-01-28 DIAGNOSIS — I2510 Atherosclerotic heart disease of native coronary artery without angina pectoris: Secondary | ICD-10-CM

## 2024-01-28 DIAGNOSIS — R0683 Snoring: Secondary | ICD-10-CM

## 2024-01-28 NOTE — Telephone Encounter (Signed)
 Left VM with callback number for patient to receive sleep study results and recommendations.

## 2024-01-28 NOTE — Telephone Encounter (Signed)
-----   Message from Armanda Magic sent at 01/28/2024  8:02 AM EST ----- Please let patient know that they have sleep apnea.  Recommend therapeutic CPAP titration for treatment of patient's sleep disordered breathing.

## 2024-01-28 NOTE — Procedures (Signed)
    SLEEP STUDY REPORT Patient Information Study Date: 01/27/2024 Patient Name: Joseph Jimenez Patient ID: [id number] Birth Date: 1955/12/15 Age: 68 Gender: Male BMI: 30.6 (W=214 lb, H=5' 10'') Stopbang: 6 Referring Physician: Dietrich Pates, MD  TEST DESCRIPTION: Home sleep apnea testing was completed using the WatchPat, a Type 1 device, utilizing  peripheral arterial tonometry (PAT), chest movement, actigraphy, pulse oximetry, pulse rate, body position and snore.  AHI was calculated with apnea and hypopnea using valid sleep time as the denominator. RDI includes apneas,  hypopneas, and RERAs. The data acquired and the scoring of sleep and all associated events were performed in  accordance with the recommended standards and specifications as outlined in the AASM Manual for the Scoring of  Sleep and Associated Events 2.2.0 (2015).  FINDINGS:  1. Moderate to Severe Obstructive Sleep Apnea with AHI 27/hr.   2. No Central Sleep Apnea with pAHIc 6/hr.  3. Oxygen desaturations as low as 75%.  4. Moderate snoring was present. O2 sats were < 88% for 27 min.  5. Total sleep time was 5 hrs and 40 min.  6. 24% of total sleep time was spent in REM sleep.   7. Normal sleep onset latency at 17 min  8. Prolonged REM sleep onset latency at 101 min.   9. Total awakenings were 10.  10. Arrhythmia detection: None  DIAGNOSIS:  Moderate to Severe Obstructive Sleep Apnea (G47.33) Nocturnal Hypoxemia  RECOMMENDATIONS: 1. Clinical correlation of these findings is necessary. The decision to treat obstructive sleep apnea (OSA) is usually  based on the presence of apnea symptoms or the presence of associated medical conditions such as Hypertension,  Congestive Heart Failure, Atrial Fibrillation or Obesity. The most common symptoms of OSA are snoring, gasping for  breath while sleeping, daytime sleepiness and fatigue.   2. Initiating apnea therapy is recommended given the presence of symptoms and/or  associated conditions.  Recommend proceeding with one of the following:   a. Auto-CPAP therapy with a pressure range of 5-20cm H2O.   b. An oral appliance (OA) that can be obtained from certain dentists with expertise in sleep medicine. These are  primarily of use in non-obese patients with mild and moderate disease.   c. An ENT consultation which may be useful to look for specific causes of obstruction and possible treatment  options.   d. If patient is intolerant to PAP therapy, consider referral to ENT for evaluation for hypoglossal nerve stimulator.   3. Close follow-up is necessary to ensure success with CPAP or oral appliance therapy for maximum benefit .  4. A follow-up oximetry study on CPAP is recommended to assess the adequacy of therapy and determine the need  for supplemental oxygen or the potential need for Bi-level therapy. An arterial blood gas to determine the adequacy of  baseline ventilation and oxygenation should also be considered.  5. Healthy sleep recommendations include: adequate nightly sleep (normal 7-9 hrs/night), avoidance of caffeine after  noon and alcohol near bedtime, and maintaining a sleep environment that is cool, dark and quiet.  6. Weight loss for overweight patients is recommended. Even modest amounts of weight loss can significantly  improve the severity of sleep apnea.  7. Snoring recommendations include: weight loss where appropriate, side sleeping, and avoidance of alcohol before  bed.  8. Operation of motor vehicle should not be performed when sleepy.  Signature: Armanda Magic, MD; [facility]; Diplomat, American Board of Sleep  Medicine Electronically Signed: 01/28/2024 8:01:15 AM

## 2024-02-05 ENCOUNTER — Telehealth: Payer: Self-pay

## 2024-02-05 NOTE — Telephone Encounter (Signed)
 Spoke with patient. Patient request more information on alternatives for Obstructive Sleep Apnea Therapy. Will schedule in-person visit for discussion on options.

## 2024-06-11 ENCOUNTER — Other Ambulatory Visit: Payer: Self-pay | Admitting: Internal Medicine

## 2024-06-11 DIAGNOSIS — I2510 Atherosclerotic heart disease of native coronary artery without angina pectoris: Secondary | ICD-10-CM

## 2024-06-11 DIAGNOSIS — E785 Hyperlipidemia, unspecified: Secondary | ICD-10-CM

## 2024-09-04 ENCOUNTER — Other Ambulatory Visit: Payer: Self-pay | Admitting: Internal Medicine

## 2024-09-04 DIAGNOSIS — E785 Hyperlipidemia, unspecified: Secondary | ICD-10-CM

## 2024-09-04 DIAGNOSIS — I2510 Atherosclerotic heart disease of native coronary artery without angina pectoris: Secondary | ICD-10-CM

## 2024-12-02 ENCOUNTER — Other Ambulatory Visit: Payer: Self-pay | Admitting: Internal Medicine

## 2024-12-02 DIAGNOSIS — I2510 Atherosclerotic heart disease of native coronary artery without angina pectoris: Secondary | ICD-10-CM

## 2024-12-02 DIAGNOSIS — E785 Hyperlipidemia, unspecified: Secondary | ICD-10-CM
